# Patient Record
Sex: Female | Born: 1965
Health system: Southern US, Community
[De-identification: ages and names within clinical notes are randomized; demographics above are authoritative.]

## PROBLEM LIST (undated history)

## (undated) DIAGNOSIS — E079 Disorder of thyroid, unspecified: Secondary | ICD-10-CM

## (undated) HISTORY — DX: Disorder of thyroid, unspecified: E07.9

## (undated) HISTORY — PX: WISDOM TOOTH EXTRACTION: SHX21

## (undated) HISTORY — PX: TUBAL LIGATION: SHX77

## (undated) HISTORY — PX: COSMETIC SURGERY: SHX468

---

## 1997-11-18 ENCOUNTER — Ambulatory Visit (HOSPITAL_BASED_OUTPATIENT_CLINIC_OR_DEPARTMENT_OTHER): Admission: RE | Admit: 1997-11-18 | Discharge: 1997-11-18 | Payer: Self-pay | Admitting: Specialist

## 1998-12-12 ENCOUNTER — Other Ambulatory Visit: Admission: RE | Admit: 1998-12-12 | Discharge: 1998-12-12 | Payer: Self-pay | Admitting: Obstetrics

## 1999-01-08 ENCOUNTER — Other Ambulatory Visit: Admission: RE | Admit: 1999-01-08 | Discharge: 1999-01-08 | Payer: Self-pay | Admitting: *Deleted

## 1999-01-17 ENCOUNTER — Ambulatory Visit (HOSPITAL_COMMUNITY): Admission: RE | Admit: 1999-01-17 | Discharge: 1999-01-17 | Payer: Self-pay | Admitting: Family Medicine

## 1999-01-17 ENCOUNTER — Encounter: Payer: Self-pay | Admitting: Family Medicine

## 2000-08-04 ENCOUNTER — Other Ambulatory Visit: Admission: RE | Admit: 2000-08-04 | Discharge: 2000-08-04 | Payer: Self-pay | Admitting: Obstetrics and Gynecology

## 2000-11-21 ENCOUNTER — Inpatient Hospital Stay (HOSPITAL_COMMUNITY): Admission: AD | Admit: 2000-11-21 | Discharge: 2000-11-21 | Payer: Self-pay

## 2001-02-01 ENCOUNTER — Inpatient Hospital Stay (HOSPITAL_COMMUNITY): Admission: AD | Admit: 2001-02-01 | Discharge: 2001-02-03 | Payer: Self-pay | Admitting: Obstetrics and Gynecology

## 2001-02-01 ENCOUNTER — Encounter (INDEPENDENT_AMBULATORY_CARE_PROVIDER_SITE_OTHER): Payer: Self-pay | Admitting: Specialist

## 2001-08-03 ENCOUNTER — Other Ambulatory Visit: Admission: RE | Admit: 2001-08-03 | Discharge: 2001-08-03 | Payer: Self-pay | Admitting: Obstetrics and Gynecology

## 2002-08-03 ENCOUNTER — Other Ambulatory Visit: Admission: RE | Admit: 2002-08-03 | Discharge: 2002-08-03 | Payer: Self-pay | Admitting: Obstetrics and Gynecology

## 2003-05-05 ENCOUNTER — Ambulatory Visit (HOSPITAL_COMMUNITY): Admission: RE | Admit: 2003-05-05 | Discharge: 2003-05-05 | Payer: Self-pay | Admitting: Endocrinology

## 2005-02-01 ENCOUNTER — Ambulatory Visit: Payer: Self-pay | Admitting: Endocrinology

## 2005-06-28 ENCOUNTER — Other Ambulatory Visit: Admission: RE | Admit: 2005-06-28 | Discharge: 2005-06-28 | Payer: Self-pay | Admitting: Obstetrics and Gynecology

## 2005-09-16 ENCOUNTER — Ambulatory Visit (HOSPITAL_COMMUNITY): Admission: RE | Admit: 2005-09-16 | Discharge: 2005-09-16 | Payer: Self-pay | Admitting: Obstetrics and Gynecology

## 2006-03-06 ENCOUNTER — Ambulatory Visit: Payer: Self-pay | Admitting: Endocrinology

## 2007-02-13 ENCOUNTER — Encounter: Payer: Self-pay | Admitting: *Deleted

## 2007-02-13 DIAGNOSIS — H1010 Acute atopic conjunctivitis, unspecified eye: Secondary | ICD-10-CM | POA: Insufficient documentation

## 2007-02-13 DIAGNOSIS — J309 Allergic rhinitis, unspecified: Secondary | ICD-10-CM

## 2007-03-10 ENCOUNTER — Encounter: Payer: Self-pay | Admitting: Endocrinology

## 2007-03-10 ENCOUNTER — Ambulatory Visit: Payer: Self-pay | Admitting: Endocrinology

## 2007-03-10 LAB — CONVERTED CEMR LAB
Free T4: 0.9 ng/dL (ref 0.6–1.6)
TSH: 0.21 microintl units/mL — ABNORMAL LOW (ref 0.35–5.50)

## 2007-03-16 ENCOUNTER — Encounter: Admission: RE | Admit: 2007-03-16 | Discharge: 2007-03-16 | Payer: Self-pay | Admitting: Endocrinology

## 2007-04-17 ENCOUNTER — Encounter: Admission: RE | Admit: 2007-04-17 | Discharge: 2007-04-17 | Payer: Self-pay | Admitting: Endocrinology

## 2007-07-13 ENCOUNTER — Ambulatory Visit: Payer: Self-pay | Admitting: Endocrinology

## 2007-07-13 DIAGNOSIS — E0590 Thyrotoxicosis, unspecified without thyrotoxic crisis or storm: Secondary | ICD-10-CM | POA: Insufficient documentation

## 2007-07-13 LAB — CONVERTED CEMR LAB: TSH: 1.63 microintl units/mL (ref 0.35–5.50)

## 2007-08-11 ENCOUNTER — Ambulatory Visit (HOSPITAL_COMMUNITY): Admission: RE | Admit: 2007-08-11 | Discharge: 2007-08-11 | Payer: Self-pay | Admitting: Obstetrics and Gynecology

## 2007-08-11 ENCOUNTER — Ambulatory Visit: Payer: Self-pay | Admitting: Endocrinology

## 2008-08-16 ENCOUNTER — Ambulatory Visit (HOSPITAL_COMMUNITY): Admission: RE | Admit: 2008-08-16 | Discharge: 2008-08-16 | Payer: Self-pay | Admitting: Obstetrics and Gynecology

## 2009-08-17 ENCOUNTER — Ambulatory Visit (HOSPITAL_COMMUNITY): Admission: RE | Admit: 2009-08-17 | Discharge: 2009-08-17 | Payer: Self-pay | Admitting: Obstetrics and Gynecology

## 2010-04-27 ENCOUNTER — Ambulatory Visit: Payer: Self-pay | Admitting: Internal Medicine

## 2010-04-27 DIAGNOSIS — J019 Acute sinusitis, unspecified: Secondary | ICD-10-CM

## 2010-06-28 NOTE — Assessment & Plan Note (Signed)
Summary: NEW PT--[facility]--SINUS PROBLEM-PER DR Sammuel Cooper OK D/T---[facility]   Vital Signs:  Patient profile:   45 year old female Height:      66 inches Weight:      206.25 pounds BMI:     33.41 O2 Sat:      98 % on Room air Temp:     98.4 degrees F oral Pulse rate:   104 / minute BP sitting:   100 / 60  (left arm) Cuff size:   large  Vitals Entered By: Zella Ball Ewing CMA Duncan Dull) (April 27, 2010 4:07 PM)  O2 Flow:  Room air CC: New Patient, sinus congestion/RE   Primary Care Jaila Schellhorn:  Jonny Ruiz  CC:  New Patient and sinus congestion/RE.  History of Present Illness:  here with acute visit to establish with new PCP;  overall doing ok until 3 days ago with mild to mod fever, headache, facial pain, pressure and greenish d/c, mild ST and nonprod cough;  Pt denies CP, worsening sob, doe, wheezing, orthopnea, pnd, worsening LE edema, palps, dizziness or syncope  .Pt denies new neuro symptoms such as headache, facial or extremity weakness  Pt denies polydipsia, polyuria.  Overall good compliance with meds, trying to follow low chol diet  wt stable, little excercise however   Preventive Screening-Counseling & Management  Alcohol-Tobacco     Smoking Status: never      Drug Use:  no.    Problems Prior to Update: 1)  Sinusitis- Acute-nos  (ICD-461.9) 2)  Hyperthyroidism  (ICD-242.90) 3)  Allergic Rhinitis  (ICD-477.9)  Medications Prior to Update: 1)  None  Current Medications (verified): 1)  Levothyroxine Sodium 100 Mcg Tabs (Levothyroxine Sodium) .Marland Kitchen.. 1 By Mouth Once Daily 2)  Levofloxacin 500 Mg Tabs (Levofloxacin) .Marland Kitchen.. 1po Once Daily  Allergies (verified): No Known Drug Allergies  Past History:  Family History: Last updated: 04/27/2010 father with HTN aunt with DM  Social History: Last updated: 04/27/2010 Single 2 children work - cust service - [employer] Never Smoked Alcohol use-yes - social  Drug use-no  Risk Factors: Smoking Status: never (04/27/2010)  Past Medical  History: Allergic rhinitis Hyperthyroidism - Dr Talmage Nap  Past Surgical History: Reviewed history from 02/13/2007 and no changes required. Tubal ligation (1999) Breast Reduction (01/2001)  Family History: Reviewed history and no changes required. father with HTN aunt with DM  Social History: Reviewed history and no changes required. Single 2 children work - Biomedical engineer - [employer] Never Smoked Alcohol use-yes - social  Drug use-no Drug Use:  no  Review of Systems       all otherwise negative per pt -    Physical Exam  General:  alert and overweight-appearing., mild ill  Head:  normocephalic and atraumatic.   Eyes:  vision grossly intact, pupils equal, and pupils round.   Ears:  bilat tm's erythema, sinus tender bilat maxillary area Nose:  nasal dischargemucosal pallor and mucosal edema.   Mouth:  pharyngeal erythema and fair dentition.   Neck:  supple and cervical lymphadenopathy.   Lungs:  normal respiratory effort and normal breath sounds.   Heart:  normal rate and regular rhythm.   Extremities:  no edema, no erythema    Impression & Recommendations:  Problem # 1:  SINUSITIS- ACUTE-NOS (ICD-461.9)  Her updated medication list for this problem includes:    Levofloxacin 500 Mg Tabs (Levofloxacin) .Marland Kitchen... 1po once daily treat as above, f/u any worsening signs or symptoms   Complete Medication List: 1)  Levothyroxine Sodium 100 Mcg  Tabs (Levothyroxine sodium) .Marland Kitchen.. 1 by mouth once daily 2)  Levofloxacin 500 Mg Tabs (Levofloxacin) .Marland Kitchen.. 1po once daily  Patient Instructions: 1)  Please take all new medications as prescribed 2)  Continue all previous medications as before this visit  3)  You can also use Mucinex D OTC or it's generic for congestion  4)  Please schedule a follow-up appointment as needed. Prescriptions: LEVOFLOXACIN 500 MG TABS (LEVOFLOXACIN) 1po once daily  #10 x 0   Entered and Authorized by:   Corwin Levins MD   Signed by:   Corwin Levins MD on  04/27/2010   Method used:   Print then Give to Patient   RxID:   [id number]    Orders Added: 1)  Est. Patient Level III [28413]

## 2010-07-11 ENCOUNTER — Other Ambulatory Visit: Payer: Self-pay

## 2010-07-11 DIAGNOSIS — Z1231 Encounter for screening mammogram for malignant neoplasm of breast: Secondary | ICD-10-CM

## 2010-08-20 ENCOUNTER — Ambulatory Visit (HOSPITAL_COMMUNITY)
Admission: RE | Admit: 2010-08-20 | Discharge: 2010-08-20 | Disposition: A | Discharge status: Home / Self Care | Payer: 59 | Source: Ambulatory Visit | Attending: Obstetrics and Gynecology | Admitting: Obstetrics and Gynecology

## 2010-08-20 DIAGNOSIS — Z1231 Encounter for screening mammogram for malignant neoplasm of breast: Secondary | ICD-10-CM | POA: Insufficient documentation

## 2010-10-12 NOTE — Op Note (Signed)
NAMENANSI, B[place]HAM            ACCOUNT NO.:  [id number]   MEDICAL RECORD NO.:  [id number]          PATIENT TYPE:  AMB   LOCATION:  [facility]                           FACILITY:  [facility]   PHYSICIAN:  Dois Davenport A. Rivard, M.D. DATE OF BIRTH:  [birth year]   DATE OF PROCEDURE:  09/16/2005  DATE OF DISCHARGE:                                 OPERATIVE REPORT   PREOPERATIVE DIAGNOSIS:  Right lower quadrant pain.   POSTOPERATIVE DIAGNOSIS:  Right lower quadrant pain with pelvic adhesions.   ANESTHESIA:  General,  Dr. Dell Ponto.   PROCEDURE:  Laparoscopy, lysis of adhesions.   SURGEON:  Dr. Estanislado Pandy.   ESTIMATED BLOOD LOSS:  Minimal.   PROCEDURE:  After being informed of the planned procedure with possible  complications including bleeding, infection, injury to bowel, bladder or  ureters, and need for laparotomy, informed consent is obtained.  The patient  is taken to OR #3, given general anesthesia with endotracheal intubation  without complication. She is placed in the lithotomy position, prepped and  draped in sterile fashion. Her bladder is emptied with an in-and-out Foley  catheter. A weighted speculum was inserted in the vagina. Anterior lip of  the cervix was grasped with tenaculum forceps and the acorn manipulator is  placed. Speculum is removed.   We infiltrate the umbilical area using 9 mL of Marcaine 0.25 and perform an  umbilical incision which was brought down to the fascia overlooking the  previously existing incision. Fascia is then identified and grasped with  Kocher forceps. Fascia is then opened with scissors. Peritoneum is entered  bluntly with hemostat clamp. A pursestring suture of 0 Vicryl is placed on  the fascia and a Hassan trocar is easily inserted, held in place with  pursestring suture. We then insufflated pneumoperitoneum with CO2 at a  maximum pressure of 15 mmHg and we can insert the laparoscope on video  monitor. A suprapubic 5 mm trocar is then placed under direct  visualization  after infiltrating the area with 3 mL of Marcaine 0.25%.   Observation: Anterior cul-de-sac is normal. Uterus is bulky and soft which  could be compatible with adenomyosis but no other gross anomalies is noted.  Posterior cul-de-sac contains 37 mL of clear fluid which was aspirated so  that we can evaluate the peritoneum and uterosacral ligaments. Posterior cul-  de-sac is normal. Right tube is normal other than status post tubal  ligation. We do see two small paratubal cysts of less than 0.5 cm. Right  ovary is normal. The right ovarian fossa is completely normal and we see  normal peristalsis from the ureter. The appendix is then completely  visualized and normal.  Liver is normal. Left tube had a site of tubal  ligation and also is  held with a pelvic wall with a fine adhesions. Left  ovary shows the site of a recent ovulation with a corpus luteum, is  otherwise unremarkable.  Left ovarian fossa is normal and we do see normal  peristalsis from the ureter. Also on the left abdominal wall, we do see fine  adhesions holding the bowel to the wall.  Decision is made to proceed with  lysis of adhesions using sharp scissors and bipolar cauterization. We  released the tube from the pelvic wall as well as the bowel from the left  lateral wall. This was done easily with good visualization. We then irrigate  this area with saline and note a satisfactory hemostasis.   We then removed all instruments after evacuating the pneumoperitoneum and we  closed the fascia of the umbilical incision using our previously placed  pursestring suture. Unfortunately, the knot gives away and this and we need  to place a second suture on the fascia with a figure-of-eight stitch of 0  Vicryl. Skin is then closed with subcuticular suture of 3-0 Vicryl and Steri-  Strips.   Instrument and sponge count is complete x2. Estimated blood loss is minimal.  The procedures very well tolerated by the patient who  is taken to recovery  room in a well and stable condition.      Crist Fat Rivard, M.D.  Electronically Signed     SAR/MEDQ  D:  09/16/2005  T:  09/17/2005  Job:  [id number]

## 2010-10-12 NOTE — Discharge Summary (Signed)
[facility]  Patient:    Bianca Marquez, Bianca Marquez Visit Number: [id number] MRN: [id number]          Service Type: OBS Location: 910A [id number] 01 Attending Physician:  Leonard Schwartz Dictated by:   Marcelle Smiling Clelia Croft, C.N.M. Admit Date:  02/01/2001 Discharge Date: 02/03/2001                             Discharge Summary  DATE OF BIRTH:                12-Aug-1965.  ADMISSION DIAGNOSES:          1. Intrauterine pregnancy at term.                               2. Desires postpartum bilateral tubal ligation.  DISCHARGE DIAGNOSES:          1. Intrauterine pregnancy at term.                               2. Normal spontaneous vaginal birth.                               3. Postpartum sterilization.  PROCEDURES:                   Normal spontaneous vaginal birth and postpartum bilateral tubal ligation.  HOSPITAL COURSE:              [name] is a 45 year old gravida 2, para 1-0-0-1, who presented in active labor on February 01, 2001.  Pregnancy had been remarkable for: (1) Rh negative, (2) borderline pelvis, (3) hyperthyroidism, (4) history of breast reduction, and (5) desire BTL.  The patient progressed rapidly and normal spontaneous birth over intact perineum. Infant was a viable female named [place].  Weight was 6 pounds and 8 ounces with Apgars 9 and 9.  The patient was bottle-feeding.  Hemoglobin was 12.6 on day #1 postpartum.  The patient desired postpartum bilateral tubal ligation.  This was performed by Naima A. Dillard, M.D., on day #1 postpartum without complications.  By postpartum day #2 the patient was doing well and was deemed to have received the full benefit of her hospital stay. She was discharged home.  DISCHARGE INSTRUCTIONS:       Per [facility] handout.  DISCHARGE MEDICATIONS:        Motrin 600 mg p.o. q.6h. p.r.n. pain.  DISCHARGE FOLLOW-UP:          This will occur at six weeks postpartum at [facility]  OB/GYN. Dictated by:   Marcelle Smiling Clelia Croft, C.N.M. Attending Physician:  Leonard Schwartz DD:  02/03/01 TD:  02/03/01 Job: [id number]

## 2010-10-12 NOTE — H&P (Signed)
[facility]  Patient:    Bianca Marquez, Bianca Marquez Visit Number: [id number] MRN: [id number]          Service Type: OBS Location: [facility] Attending Physician:  Leonard Schwartz Dictated by:   Mack Guise, C.N.M. Adm. Date:  02/01/01                           History and Physical  HISTORY OF PRESENT ILLNESS:   Bianca Marquez is a 45 year old, gravida 2, para 1-0-0-1 at 38-2/7 weeks who presents in advanced active labor at 8 cm. She began her labor at 4:30 this morning and it has quickly increased in its intensity. She reports positive fetal movement, no bleeding, no rupture of membranes, denies any headaches, visual changes, or epigastric pain. Her pregnancy was initially evaluated at the office of [facility] on August 04, 2000 at [redacted] weeks gestation. Her pregnancy was determined by LMP dates and confirmed with pregnancy ultrasonography. Her pregnancy has unremarkable being size equal to dates throughout, normotensive with no proteinuria.  PRENATAL LABORATORY DATA:     Her prenatal lab work on July 31, 2000: Hemoglobin and hematocrit were 12.2 and 37.5, platelets were 330,000. Blood type and Rh AB negative, antibody screen negative, sickle cell negative, VDRL nonreactive, rubella immune, hepatitis B surface antigen negative, Pap smear within normal limits, GC and Chlamydia negative. AFP/free beta hCG was declined. At 28 weeks one-hour glucose challenge was 100. The patient did receive RhoGAM at 28 weeks and at 36 weeks culture of the vaginal tract is negative for group B strep.  OBSTETRICAL HISTORY:          In 1991 normal spontaneous vaginal delivery with the birth of 6-pound 8-ounce female infant at term with no complications and the present pregnancy.  PAST MEDICAL HISTORY:         Patient is hyperthyroid. She has had an abnormal Pap smear with conization done, history of Chlamydia in the year 2000.  PAST SURGICAL HISTORY:        In 1999 the patient had a  breast reduction surgery and wisdom teeth.  FAMILY HISTORY:               Father with a history of hypertension. Father with thyroidectomy and tracheostomy due to vocal cord damage. Paternal grandmother colon cancer, maternal aunt breast cancer.  GENETIC HISTORY:              There is no family history of familial or genetic disorders, children that died in infancy, or that were born with birth defects.  ALLERGIES:                    No known drug allergies.  HABITS:                       Patient denies the use of tobacco, alcohol, or illicit drugs.  SOCIAL HISTORY:               Bianca Marquez is a 45 year old African-American single female. She is of the [employer].  REVIEW OF SYSTEMS:            There are no signs or symptoms suggestive of focal or systemic disease and the patient is typical of one with a uterine pregnancy at term in advanced active labor.  PHYSICAL EXAMINATION:  VITAL SIGNS:  Stable, afebrile.  HEENT:                        Unremarkable.  HEART:                        Regular rate and rhythm.  LUNGS:                        Clear.  ABDOMEN:                      Gravid in its contour. It is soft and nontender. Leopolds maneuvers finds the infant to be in a longitudinal lie, cephalic presentation, and the estimated fetal weight is 7 pounds.  PELVIC:                       Digital exam of the cervix is 9 cm dilated with a bulging bag of water. Artificial rupture of membranes was employed with a return of clear fluid. Cervix is completely effaced with a cephalic presenting part at a -1 station.  ELECTRONIC FETAL MONITORING:  The fetal heart rate is entirely reassuring with accelerations noted. There were occasional mild variable decelerations. The patient is contracting every two to three minutes.  NEUROLOGIC/EXTREMITIES:       DTRs are 1+ with no clonus. Extremities show no pathologic edema.  ASSESSMENT:                   1.  Intrauterine pregnancy at term.                               2. Active labor.  PLAN:                         Admit per Dr. Stefano Gaul with routine CNM orders and anticipate spontaneous vaginal delivery. Dictated by:   Mack Guise, C.N.M. Attending Physician:  Leonard Schwartz DD:  02/01/01 TD:  02/01/01 Job: [id number]

## 2010-10-12 NOTE — Op Note (Signed)
[facility]  Patient:    Bianca Marquez, Bianca Marquez Visit Number: [id number] MRN: [id number]          Service Type: OBS Location: [facility] Attending Physician:  Leonard Schwartz Dictated by:   Pierre Bali Normand Sloop, M.D.                             Operative Report  PREOPERATIVE DIAGNOSES:       Multiparity, desires postpartum tubal ligation.  POSTOPERATIVE DIAGNOSES:      Multiparity, desires postpartum tubal ligation.  HISTORY:                      The patient was consented for the procedure. Was told the risks of bleeding, infection, and damage to internal organs such as bowel and bladder, and estimated failure rate of 1 in 250.  PROCEDURE:                    Postpartum tubal ligation, modified Pomeroy method.  SURGEON:                      Naima A. Normand Sloop, M.D.  ANESTHESIA:                   General.  ESTIMATED BLOOD LOSS:         Minimal.  INTRAVENOUS FLUIDS:           Crystalloid 1250 cc.  COMPLICATIONS:                None.  FINDINGS:                     Normal tubes bilaterally.                                Patient went to the recovery room in stable condition.  PROCEDURE:                    Patient was taken to the operating room.  She was given general anesthesia.  After being placed in the dorsal supine position she was prepped and draped in a normal sterile fashion.  A small infraumbilical incision was made with the scalpel and carried down to the fascia.  The fascia was then extended bilaterally using Mayo scissors.  The peritoneum was identified, tented up, and entered sharply with Metzenbaum scissors and extended bilaterally using Metzenbaum scissors.  The patients left fallopian tube was identified, grasped with Babcock clamps, and followed to the fimbriated end.  About a 2 cm portion of tube was excised approximately 3 cm away from the cornua.  First the tube was ligated with 2-0 plain and then excised.  Hemostasis was noted.   The patients left fallopian tube was identified and followed out to the fimbriated end in a similar fashion.  A 2 cm portion of tube was removed about 3 cm away from the corneal region.  It was ligated with 2-0 plain and excised.  Hemostasis was assured.  The fascia was closed with 0 Vicryl.  The skin was closed with 4-0 Vicryl.  Patient went to recovery room in stable condition. Dictated by:   Pierre Bali. Normand Sloop, M.D. Attending Physician:  Leonard Schwartz DD:  02/02/01 TD:  02/02/01 Job: [id number]

## 2011-08-05 ENCOUNTER — Other Ambulatory Visit: Payer: Self-pay | Admitting: Obstetrics and Gynecology

## 2011-08-05 DIAGNOSIS — Z1231 Encounter for screening mammogram for malignant neoplasm of breast: Secondary | ICD-10-CM

## 2011-08-17 ENCOUNTER — Encounter: Payer: Self-pay | Admitting: Family Medicine

## 2011-08-17 ENCOUNTER — Ambulatory Visit (INDEPENDENT_AMBULATORY_CARE_PROVIDER_SITE_OTHER): Payer: 59 | Admitting: Family Medicine

## 2011-08-17 VITALS — BP 130/82 | Temp 98.3°F | Wt 198.0 lb

## 2011-08-17 DIAGNOSIS — J069 Acute upper respiratory infection, unspecified: Secondary | ICD-10-CM

## 2011-08-17 MED ORDER — HYDROCODONE-HOMATROPINE 5-1.5 MG/5ML PO SYRP: ORAL_SOLUTION | ORAL | Status: AC

## 2011-08-17 NOTE — Progress Notes (Signed)
OFFICE NOTE  08/17/2011  CC:  Chief Complaint  Patient presents with  . Nasal Congestion     HPI: Patient is a 46 y.o. African-American female who is here for congestion. Pt presents complaining of respiratory symptoms for 2  days.  Mostly nasal congestion/runny nose, sneezing, , NT, and PND alt/w dry cough.  Worst symptoms seems to be the ST.  Lately the symptoms seem to be worsening.  No fevers, no wheezing, and no SOB.  No pain in face or teeth.  +fontal HA .   Symptoms made worse by nothing.  Symptoms improved by ST tea. Smoker? no Recent sick contact? no Muscle or joint aches? no Flu shot this season at least 2 wks ago? no  ROS: no n/v/d or abdominal pain.  No rash.  No neck stiffness.   +Mild fatigue.  +Mild appetite loss.   Pertinent PMH:  Hyperthyroidism: treated with meds Allergic rhinitis--spring season is the worst for her MEDS:  No outpatient prescriptions prior to visit.  Tylenol ST and ibuprofen.  PE: Blood pressure 130/82, temperature 98.3 F (36.8 C), temperature source Oral, weight 198 lb (89.812 kg). VS: noted--normal. Gen: alert, NAD, NONTOXIC APPEARING. HEENT: eyes without injection, drainage, or swelling.  Ears: EACs clear, TMs with normal light reflex and landmarks.  Nose: Clear rhinorrhea, with some dried, crusty exudate adherent to mildly injected mucosa.  No purulent d/c.  No paranasal sinus TTP.  No facial swelling.  Throat and mouth without focal lesion.  No pharyngial swelling, erythema, or exudate.   Neck: supple, no LAD.   LUNGS: CTA bilat, nonlabored resps.   CV: RRR, no m/r/g. EXT: no c/c/e SKIN: no rash  Rapid strep: NEG  IMPRESSION AND PLAN: Viral URI/pharyngitis. Self-limited nature of this illness was discussed, questions answered.  Discussed symptomatic care, rest, fluids.  Hycodan susp 1-2 tsp q6h prn, #117ml, no RF. Warning signs/symptoms of worsening illness were discussed.  Patient instructed to call or return if any of these  occur.   FOLLOW UP: prn

## 2011-08-29 ENCOUNTER — Ambulatory Visit (HOSPITAL_COMMUNITY)
Admission: RE | Admit: 2011-08-29 | Discharge: 2011-08-29 | Disposition: A | Discharge status: Home / Self Care | Payer: 59 | Source: Ambulatory Visit | Attending: Obstetrics and Gynecology | Admitting: Obstetrics and Gynecology

## 2011-08-29 DIAGNOSIS — Z1231 Encounter for screening mammogram for malignant neoplasm of breast: Secondary | ICD-10-CM

## 2011-12-11 ENCOUNTER — Ambulatory Visit (INDEPENDENT_AMBULATORY_CARE_PROVIDER_SITE_OTHER): Payer: 59 | Admitting: Obstetrics and Gynecology

## 2011-12-11 ENCOUNTER — Encounter: Payer: Self-pay | Admitting: Obstetrics and Gynecology

## 2011-12-11 VITALS — BP 118/72 | Ht 66.0 in | Wt 191.0 lb

## 2011-12-11 DIAGNOSIS — Z124 Encounter for screening for malignant neoplasm of cervix: Secondary | ICD-10-CM

## 2011-12-11 DIAGNOSIS — R1900 Intra-abdominal and pelvic swelling, mass and lump, unspecified site: Secondary | ICD-10-CM

## 2011-12-11 DIAGNOSIS — Z01419 Encounter for gynecological examination (general) (routine) without abnormal findings: Secondary | ICD-10-CM

## 2011-12-11 NOTE — Progress Notes (Signed)
The patient reports:normal menses, no abnormal bleeding, pelvic pain or discharge  Contraception:bilateral tubal ligation  Last mammogram: was normal April 2013 Last pap: was normal July  2012  GC/Chlamydia cultures offered: requested HIV/RPR/HbsAg offered:  requested HSV 1 and 2 glycoprotein offered: requested  Menstrual cycle regular and monthly: Yes Menstrual flow normal: Yes  Urinary symptoms: none Normal bowel movements: Yes Reports abuse at home: No  Subjective:    Bianca Marquez is a 46 y.o. female, G2P2, who presents for an annual exam.     History   Social History  . Marital Status: Single    Spouse Name: N/A    Number of Children: N/A  . Years of Education: N/A   Social History Main Topics  . Smoking status: Never Smoker   . Smokeless tobacco: Never Used  . Alcohol Use: No  . Drug Use: No  . Sexually Active: Yes    Birth Control/ Protection: Surgical     btl   Other Topics Concern  . None   Social History Narrative  . None    Menstrual cycle:   LMP: Patient's last menstrual period was 12/05/2011.           Cycle: normal  The following portions of the patient's history were reviewed and updated as appropriate: allergies, current medications, past family history, past medical history, past social history, past surgical history and problem list.  Review of Systems Pertinent items are noted in HPI. Breast:Negative for breast lump,nipple discharge or nipple retraction Gastrointestinal: Negative for abdominal pain, change in bowel habits or rectal bleeding Urinary:negative   Objective:    BP 118/72  Ht 5\' 6"  (1.676 m)  Wt 191 lb (86.637 kg)  BMI 30.83 kg/m2  LMP 12/05/2011    Weight:  Wt Readings from Last 1 Encounters:  12/11/11 191 lb (86.637 kg)          BMI: Body mass index is 30.83 kg/(m^2).  General Appearance: Alert, appropriate appearance for age. No acute distress HEENT: Grossly normal Neck / Thyroid: Supple, no masses, nodes or  enlargement Lungs: clear to auscultation bilaterally Back: No CVA tenderness Breast Exam: No masses or nodes.No dimpling, nipple retraction or discharge. Cardiovascular: Regular rate and rhythm. S1, S2, no murmur Gastrointestinal: Soft, non-tender, no masses or organomegaly Pelvic Exam: Vulva and vagina appear normal. Bimanual exam reveals normal uterus and left adnexa. Right adnexa enlarged? Rectovaginal: normal rectal, no masses Lymphatic Exam: Non-palpable nodes in neck, clavicular, axillary, or inguinal regions Skin: no rash or abnormalities Neurologic: Normal gait and speech, no tremor  Psychiatric: Alert and oriented, appropriate affect.     Assessment:    N  Possible pelvic mass   Plan:    mammogram pap smear return annually or prn STD screening: declined Will order pelvic ultrasound Contraception:bilateral tubal ligation      Miki Labuda AMD

## 2011-12-13 LAB — PAP IG, CT-NG, RFX HPV ASCU
Chlamydia Probe Amp: NEGATIVE
GC Probe Amp: NEGATIVE

## 2012-01-17 ENCOUNTER — Other Ambulatory Visit: Payer: Self-pay | Admitting: Obstetrics and Gynecology

## 2012-01-17 ENCOUNTER — Ambulatory Visit (INDEPENDENT_AMBULATORY_CARE_PROVIDER_SITE_OTHER): Payer: 59

## 2012-01-17 ENCOUNTER — Ambulatory Visit (INDEPENDENT_AMBULATORY_CARE_PROVIDER_SITE_OTHER): Payer: 59 | Admitting: Obstetrics and Gynecology

## 2012-01-17 ENCOUNTER — Encounter: Payer: Self-pay | Admitting: Obstetrics and Gynecology

## 2012-01-17 VITALS — BP 110/64 | Wt 190.0 lb

## 2012-01-17 DIAGNOSIS — R1900 Intra-abdominal and pelvic swelling, mass and lump, unspecified site: Secondary | ICD-10-CM

## 2012-01-17 DIAGNOSIS — N83209 Unspecified ovarian cyst, unspecified side: Secondary | ICD-10-CM

## 2012-01-17 NOTE — Progress Notes (Signed)
Subjective:    Bianca Marquez is a 46 y.o. female, G2P2, who presents for Gyn ultrasound because of possible pelvic mass at [facility].  The following portions of the patient's history were reviewed and updated as appropriate: allergies, current medications, past family history.  Objective:    BP 110/64  Wt 190 lb (86.183 kg)  LMP 12/28/2011    Weight:  Wt Readings from Last 1 Encounters:  01/17/12 190 lb (86.183 kg)          BMI: There is no height on file to calculate BMI.  ULTRASOUND: Uterus normal    Adnexa left simple ovarian cyst 3.7 cm    Endometrium 5.3 mm    Free fluid: no    Other findings:  Fibroids 3.6 cm, 2.8 cm   Assessment:    Asymptomatic fibroids  Left ovarian cyst   Plan:    Repeat ultrasound in 3 months

## 2012-02-11 ENCOUNTER — Telehealth: Payer: Self-pay | Admitting: Obstetrics and Gynecology

## 2012-02-11 DIAGNOSIS — N83209 Unspecified ovarian cyst, unspecified side: Secondary | ICD-10-CM

## 2012-02-11 NOTE — Telephone Encounter (Signed)
U/s order entered in system.  Bianca Marquez

## 2012-04-17 ENCOUNTER — Ambulatory Visit (INDEPENDENT_AMBULATORY_CARE_PROVIDER_SITE_OTHER): Payer: 59

## 2012-04-17 ENCOUNTER — Other Ambulatory Visit: Payer: Self-pay | Admitting: Obstetrics and Gynecology

## 2012-04-17 ENCOUNTER — Encounter: Payer: Self-pay | Admitting: Obstetrics and Gynecology

## 2012-04-17 ENCOUNTER — Ambulatory Visit (INDEPENDENT_AMBULATORY_CARE_PROVIDER_SITE_OTHER): Payer: 59 | Admitting: Obstetrics and Gynecology

## 2012-04-17 VITALS — BP 110/72 | Resp 14 | Ht 66.0 in | Wt 193.0 lb

## 2012-04-17 DIAGNOSIS — N83202 Unspecified ovarian cyst, left side: Secondary | ICD-10-CM

## 2012-04-17 DIAGNOSIS — N83209 Unspecified ovarian cyst, unspecified side: Secondary | ICD-10-CM

## 2012-04-17 NOTE — Progress Notes (Signed)
Subjective:    Bianca Marquez is a 46 y.o. female, G2P2, who presents for Gyn ultrasound because of L OV cysts.  The following portions of the patient's history were reviewed and updated as appropriate: allergies, current medications, past family history.  Objective:    BP 110/72  Resp 14  Ht 5\' 6"  (1.676 m)  Wt 193 lb (87.544 kg)  BMI 31.15 kg/m2  LMP 04/04/2012    Weight:  Wt Readings from Last 1 Encounters:  04/17/12 193 lb (87.544 kg)          BMI: Body mass index is 31.15 kg/(m^2).  ULTRASOUND: Uterus Anteverted     Adnexa N/A    Endometrium 0.397 mm    Free fluid: no    Other findings:  Several small Nabothian cysts and several myometrial fibroids are seen    RT anterior - 3.3 cm x 3.1cm x 3.8 cm    RT anteriror - 3.0 cm. X 2.2 cm x 2.7 cm    Posterior Lus - 2.9 cm x 2.3 cm x 2.9 cm     RTOV WNL's    LTOV WNL's previously seen cysts are not visualized  Assessment:    left ovarian cyst resolved   NSC in fibroids Plan:    Follow-up at [facility]  Silverio Lay MD

## 2012-08-13 ENCOUNTER — Other Ambulatory Visit: Payer: Self-pay | Admitting: Obstetrics and Gynecology

## 2012-08-13 DIAGNOSIS — Z1231 Encounter for screening mammogram for malignant neoplasm of breast: Secondary | ICD-10-CM

## 2012-09-01 ENCOUNTER — Ambulatory Visit (HOSPITAL_COMMUNITY): Payer: 59

## 2012-09-09 ENCOUNTER — Ambulatory Visit (HOSPITAL_COMMUNITY)
Admission: RE | Admit: 2012-09-09 | Discharge: 2012-09-09 | Disposition: A | Discharge status: Home / Self Care | Payer: 59 | Source: Ambulatory Visit | Attending: Obstetrics and Gynecology | Admitting: Obstetrics and Gynecology

## 2012-09-09 DIAGNOSIS — Z1231 Encounter for screening mammogram for malignant neoplasm of breast: Secondary | ICD-10-CM

## 2013-01-08 ENCOUNTER — Ambulatory Visit (INDEPENDENT_AMBULATORY_CARE_PROVIDER_SITE_OTHER): Payer: 59 | Admitting: Internal Medicine

## 2013-01-08 ENCOUNTER — Encounter: Payer: Self-pay | Admitting: Internal Medicine

## 2013-01-08 VITALS — BP 106/70 | HR 109 | Temp 98.2°F | Ht 66.0 in | Wt 198.8 lb

## 2013-01-08 DIAGNOSIS — J309 Allergic rhinitis, unspecified: Secondary | ICD-10-CM

## 2013-01-08 DIAGNOSIS — J019 Acute sinusitis, unspecified: Secondary | ICD-10-CM

## 2013-01-08 MED ORDER — LEVOFLOXACIN 250 MG PO TABS: 250.0000 mg | ORAL_TABLET | Freq: Every day | ORAL | Status: DC

## 2013-01-08 NOTE — Patient Instructions (Signed)
Please take all new medication as prescribed - the antibiotic Please continue all other medications as before, and refills have been done if requested. You can also use Allegra D (OTC) for allergis and congestion as needed  Please remember to sign up for My Chart if you have not done so, as this will be important to you in the future with finding out test results, communicating by private email, and scheduling acute appointments online when needed.

## 2013-01-10 NOTE — Progress Notes (Signed)
  Subjective:    Patient ID: Bianca Marquez, female    DOB: 03-May-1966, 47 y.o.   MRN: [id number]  HPI   Here with 2-3 days acute onset fever, facial pain, pressure, headache, general weakness and malaise, and greenish d/c, with mild ST and cough, but pt denies chest pain, wheezing, increased sob or doe, orthopnea, PND, increased LE swelling, palpitations, dizziness or syncope.  Does have several wks ongoing nasal allergy symptoms with clearish congestion, itch and sneezing, without fever, pain, ST, cough, swelling or wheezing. Past Medical History  Diagnosis Date  . Thyroid disease    Past Surgical History  Procedure Laterality Date  . Wisdom tooth extraction    . Cosmetic surgery      breast reduction   . Tubal ligation      reports that she has never smoked. She has never used smokeless tobacco. She reports that she does not drink alcohol or use illicit drugs. family history includes Breast cancer in her maternal aunt and paternal aunt; Colon cancer in her maternal grandmother; Hypertension in her father. No Known Allergies Current Outpatient Prescriptions on File Prior to Visit  Medication Sig Dispense Refill  . levothyroxine (SYNTHROID, LEVOTHROID) 100 MCG tablet Take 100 mcg by mouth daily.       No current facility-administered medications on file prior to visit.   Review of Systems All otherwise neg per pt     Objective:   Physical Exam BP 106/70  Pulse 109  Temp(Src) 98.2 F (36.8 C) (Oral)  Ht 5\' 6"  (1.676 m)  Wt 198 lb 12 oz (90.152 kg)  BMI 32.09 kg/m2  SpO2 97% VS noted, mild ill Constitutional: Pt appears well-developed and well-nourished.  HENT: Head: NCAT.  Right Ear: External ear normal.  Left Ear: External ear normal.  Bilat tm's with mild erythema.  Max sinus areas mild tender.  Pharynx with mild erythema, no exudate Eyes: Conjunctivae and EOM are normal. Pupils are equal, round, and reactive to light.  Neck: Normal range of motion. Neck supple.   Cardiovascular: Normal rate and regular rhythm.   Pulmonary/Chest: Effort normal and breath sounds normal.  Neurological: Pt is alert. Not confused  Skin: Skin is warm. No erythema.  Psychiatric: Pt behavior is normal. Thought content normal.     Assessment & Plan:

## 2013-01-10 NOTE — Assessment & Plan Note (Signed)
Ok for [employer] D prn,  to f/u any worsening symptoms or concerns

## 2013-01-10 NOTE — Assessment & Plan Note (Signed)
Mild to mod, for antibx course,  to f/u any worsening symptoms or concerns 

## 2013-04-20 ENCOUNTER — Ambulatory Visit: Payer: 59 | Admitting: Interventional Cardiology

## 2013-05-24 ENCOUNTER — Other Ambulatory Visit (INDEPENDENT_AMBULATORY_CARE_PROVIDER_SITE_OTHER): Payer: 59

## 2013-05-24 ENCOUNTER — Encounter: Payer: Self-pay | Admitting: Internal Medicine

## 2013-05-24 ENCOUNTER — Ambulatory Visit (INDEPENDENT_AMBULATORY_CARE_PROVIDER_SITE_OTHER): Payer: 59 | Admitting: Internal Medicine

## 2013-05-24 VITALS — BP 118/72 | HR 84 | Temp 97.6°F | Resp 16 | Wt 205.0 lb

## 2013-05-24 DIAGNOSIS — E0590 Thyrotoxicosis, unspecified without thyrotoxic crisis or storm: Secondary | ICD-10-CM

## 2013-05-24 DIAGNOSIS — T783XXA Angioneurotic edema, initial encounter: Secondary | ICD-10-CM

## 2013-05-24 DIAGNOSIS — E039 Hypothyroidism, unspecified: Secondary | ICD-10-CM

## 2013-05-24 LAB — CBC WITH DIFFERENTIAL/PLATELET
Basophils Absolute: 0 10*3/uL (ref 0.0–0.1)
Eosinophils Absolute: 0 10*3/uL (ref 0.0–0.7)
Hemoglobin: 14 g/dL (ref 12.0–15.0)
Lymphocytes Relative: 44.8 % (ref 12.0–46.0)
Neutrophils Relative %: 48.4 % (ref 43.0–77.0)
Platelets: 298 10*3/uL (ref 150.0–400.0)

## 2013-05-24 LAB — TSH: TSH: 4.9 u[IU]/mL (ref 0.35–5.50)

## 2013-05-24 LAB — COMPREHENSIVE METABOLIC PANEL
AST: 14 U/L (ref 0–37)
Alkaline Phosphatase: 37 U/L — ABNORMAL LOW (ref 39–117)
CO2: 24 mEq/L (ref 19–32)
Creatinine, Ser: 0.9 mg/dL (ref 0.4–1.2)
GFR: 84.25 mL/min (ref >60.00)
Total Bilirubin: 0.4 mg/dL (ref 0.3–1.2)

## 2013-05-24 MED ORDER — CETIRIZINE HCL 10 MG PO TABS: 10.0000 mg | ORAL_TABLET | Freq: Two times a day (BID) | ORAL | Status: DC

## 2013-05-24 MED ORDER — METHYLPREDNISOLONE ACETATE 80 MG/ML IJ SUSP
120.0000 mg | Freq: Once | INTRAMUSCULAR | Status: AC
  Administered 2013-05-24: 120 mg via INTRAMUSCULAR

## 2013-05-24 MED ORDER — TRIAMCINOLONE ACETONIDE 0.5 % EX CREA: 1.0000 "application " | TOPICAL_CREAM | Freq: Three times a day (TID) | CUTANEOUS | Status: DC

## 2013-05-24 NOTE — Progress Notes (Signed)
   Subjective:    Patient ID: Bianca Marquez, female    DOB: 07/28/1965, 47 y.o.   MRN: [id number]  HPI  New to me she complains of a 3 day history of feeling like her upper and lower lips are chapped and swollen. She has been taking an OTC med for allergies but she does not know what its ingredients are.   Review of Systems  Constitutional: Negative.  Negative for fever, chills, diaphoresis, activity change, appetite change, fatigue and unexpected weight change.  HENT: Negative for congestion, dental problem, drooling, facial swelling, mouth sores, nosebleeds, postnasal drip, rhinorrhea, sinus pressure, sneezing, sore throat, tinnitus, trouble swallowing and voice change.   Eyes: Negative.   Respiratory: Negative.  Negative for cough, choking, chest tightness, shortness of breath, wheezing and stridor.   Cardiovascular: Negative.  Negative for chest pain, palpitations and leg swelling.  Gastrointestinal: Negative.  Negative for nausea, vomiting, abdominal pain, diarrhea and constipation.  Endocrine: Negative.   Genitourinary: Negative.   Musculoskeletal: Negative.   Skin: Negative.   Allergic/Immunologic: Negative.   Neurological: Negative.   Hematological: Negative.  Negative for adenopathy. Does not bruise/bleed easily.  Psychiatric/Behavioral: Negative.        Objective:   Physical Exam  Vitals reviewed. Constitutional: She is oriented to person, place, and time. Vital signs are normal. She appears well-developed and well-nourished.  Non-toxic appearance. She does not have a sickly appearance. She does not appear ill. No distress.  HENT:  Head: Normocephalic and atraumatic.  Mouth/Throat: Oropharynx is clear and moist and mucous membranes are normal. Mucous membranes are not pale, not dry and not cyanotic. She does not have dentures. No oral lesions. No trismus in the jaw. Normal dentition. No dental abscesses, uvula swelling, lacerations or dental caries. No oropharyngeal  exudate, posterior oropharyngeal edema, posterior oropharyngeal erythema or tonsillar abscesses.  Her upper and lower lips are mildly and diffusely swollen, there are no lesions along the vermilion borders.  Eyes: Conjunctivae are normal. Right eye exhibits no discharge. Left eye exhibits no discharge. No scleral icterus.  Neck: Normal range of motion. Neck supple. No JVD present. No tracheal deviation present. No thyromegaly present.  Cardiovascular: Normal rate, regular rhythm, normal heart sounds and intact distal pulses.  Exam reveals no gallop and no friction rub.   No murmur heard. Pulmonary/Chest: Effort normal and breath sounds normal. No stridor. No respiratory distress. She has no wheezes. She has no rales. She exhibits no tenderness.  Abdominal: Soft. Bowel sounds are normal. She exhibits no distension and no mass. There is no tenderness. There is no rebound and no guarding.  Musculoskeletal: Normal range of motion. She exhibits no edema and no tenderness.  Lymphadenopathy:    She has no cervical adenopathy.  Neurological: She is oriented to person, place, and time.  Skin: Skin is warm and dry. No rash noted. She is not diaphoretic. No erythema. No pallor.  Psychiatric: She has a normal mood and affect. Her behavior is normal. Judgment and thought content normal.     Lab Results  Component Value Date   TSH 2.37 08/11/2007       Assessment & Plan:

## 2013-05-24 NOTE — Assessment & Plan Note (Signed)
I think there may be an nsaid in her OTC med that may be causing this so I have asked her to stop taking it I will treat the allergic reaction with an injection of depo-medrol, topical sterids, and zyrtec Will also check her labs to look for secondary causes of angioedema

## 2013-05-24 NOTE — Patient Instructions (Signed)
Angioedema Angioedema (AE) is a sudden swelling of the eyelids, lips, lobes of ears, external genitalia, skin, and other parts of the body. AE can happen by itself. It usually begins during the night and is found on awakening. It can happen with hives and other allergic reactions. Attacks can be mild and annoying, or life-threatening if the air passages swell. AE generally occurs in a short time period (over minutes to hours) and gets better in 24 to 48 hours. It usually does not cause any serious problems.  There are 2 different kinds of AE:   Allergic AE.  Nonallergic AE.  There may be an overreaction or direct stimulation of cells that are a part of the immune system (mast cells).  There may be problems with the release of chemicals made by the body that cause swelling and inflammation (kinins). AE due to kinins can be inherited from parents (hereditary), or it can develop on its own (acquired). Acquired AE either shows up before, or along with, certain diseases or is due to the body's immune system attacking parts of the body's own cells (autoimmune). CAUSES  Allergic  AE due to allergic reactions are caused by something that causes the body to react (trigger). Common triggers include:  Foods.  Medicines.  Latex.  Direct contact with certain fruits, vegetables, or animal saliva.  Insect stings. Nonallergic  Mast cell stimulation may be caused by:  Medicines.  Dyes used in X-rays.  The body's own immune system reactions to parts of the body (autoimmune disease).  Possibly, some virus infections.  AE due to problems with kinins can be hereditary or acquired. Attacks are triggered by:  Mild injury.  Dental work or any surgery.  Stress.  Sudden changes in temperature.  Exercise.  Medicines.  AE due to problems with kinins can also be due to certain medicines, especially blood pressure medicines like angiotensin-converting enzyme (ACE) inhibitors. African Americans  are at nearly 5 times greater risk of developing AE than Caucasians from ACE inhibitors. SYMPTOMS  Allergic symptoms:  Non-itchy swelling of the skin. Often the swelling is on the face and lips, but any area of the skin can swell. Sometimes, the swelling can be painful. If hives are present, there is intense itching.  Breathing problems if the air passages swell. Nonallergic symptoms:  If internal organs are involved, there may be:  Nausea.  Abdominal pain.  Vomiting.  Difficulty swallowing.  Difficulty passing urine.  Breathing problems if the air passages swell. Depending on the cause of AE, episodes may:  Only happen once (if triggers are removed or avoided).  Come back in unpredictable patterns.  Repeat for several years and then gradually fade away. DIAGNOSIS  AE is diagnosed by:   Asking questions to find out how fast the symptoms began.  Taking a family history.  Physical exam.  Diagnostic tests. Tests could include:  Allergy skin tests to see if the problem is allergic.  Blood tests to diagnose hereditary and some acquired types of AE.  Other tests to see if there is a hidden disease leading to the AE. TREATMENT  Treatment depends on the type and cause (if any) of the AE. Allergic  Allergic types of AE are treated with:  Immediate removal of the trigger or medicine (if any).  Epinephrine injection.  Steroids.  Antihistamines.  Hospitalization for severe attacks. Nonallergic  Mast cell stimulation types of AE are treated with:  Immediate removal of the trigger or medicine (if any).  Epinephrine injection.    Steroids.  Antihistamines.  Hospitalization for severe attacks.  Hereditary AE is treated with:  Medicines to prevent and treat attacks. There is little response to antihistamines, epinephrine, or steroids.  Preventive medicines before dental work or surgery.  Removing or avoiding medicines that trigger  attacks.  Hospitalization for severe attacks.  Acquired AE is treated with:  Treating underlying disease (if any).  Medicines to prevent and treat attacks. HOME CARE INSTRUCTIONS   Always carry your emergency allergy treatment medicines with you.  Wear a medical bracelet.  Avoid known triggers. SEEK MEDICAL CARE IF:   You get repeat attacks.  Your attacks are more frequent or more severe despite preventive measures.  You have hereditary AE and are considering having children. It is important to discuss the risks of passing this on to your children. SEEK IMMEDIATE MEDICAL CARE IF:   You have difficulty breathing.  You have difficulty swallowing.  You experience fainting. This condition should be treated immediately. It can be life-threatening if it involves throat swelling. Document Released: 07/22/2001 Document Revised: 08/05/2011 Document Reviewed: 01/04/2013 [employer] Patient Information 2014 [employer].  

## 2013-05-24 NOTE — Assessment & Plan Note (Signed)
I will check her TSH and will adjust her dose if needed 

## 2013-06-09 ENCOUNTER — Ambulatory Visit: Payer: 59 | Admitting: Interventional Cardiology

## 2013-06-15 ENCOUNTER — Encounter: Payer: Self-pay | Admitting: Interventional Cardiology

## 2013-06-15 ENCOUNTER — Ambulatory Visit (INDEPENDENT_AMBULATORY_CARE_PROVIDER_SITE_OTHER): Payer: 59 | Admitting: Interventional Cardiology

## 2013-06-15 VITALS — BP 106/84 | HR 88 | Ht 66.0 in | Wt 202.0 lb

## 2013-06-15 DIAGNOSIS — R002 Palpitations: Secondary | ICD-10-CM

## 2013-06-15 DIAGNOSIS — E039 Hypothyroidism, unspecified: Secondary | ICD-10-CM

## 2013-06-15 NOTE — Progress Notes (Signed)
Patient ID: Bianca Marquez, female   DOB: 1966/01/23, 48 y.o.   MRN: [id number]   Date: 06/15/2013 ID: Bianca Marquez, DOB June 29, 1965, MRN [id number] PCP: Oliver Barre, MD  Reason: Rapid heartbeat  ASSESSMENT;  1. Palpitations described as feeling the heart race, lasting less than 20 seconds before abruptly resolving. Suspect PSVT 2. Thyroid disease on thyroid replacement therapy  PLAN:  1. Since the time of the referral, the patient has had no episodes of tachycardia. At worse she was having 1 or 2 episodes per week lasting up to 20 seconds before resolving spontaneously. The current plan is clinical observation. No specific cardiac studies ordered at this time. If symptoms recur and when not able to catch it on EKG, we will perform continuous ambulatory monitoring for up to 30 days. In the meantime I have asked her to report immediately to our office or the nearest medical facility for an EKG if she develops an episode of sustained racing heart.   SUBJECTIVE: Bianca Marquez is a 48 y.o. female who is 26 with a history of thyroid disease who is referred by Dr. Cleon Gustin for evaluation of tachycardia/palpitations. Starting in November she began experiencing episodes of racing heart that would occur spontaneously and last no more than 30 seconds. He will cause her to feel odd, she would take deep breaths, she would cough, and then eventually the sensation would spontaneously resolve. She did not feel dizzy or lightheaded. There were no episodes of chest pain. No syncope or near syncope occurred. No prior history of heart disease. She denies rheumatic fever. Her only known medical problem is thyroid disease.   No Known Allergies  Current Outpatient Prescriptions on File Prior to Visit  Medication Sig Dispense Refill  . cetirizine (ZYRTEC) 10 MG tablet Take 1 tablet (10 mg total) by mouth 2 (two) times daily.  60 tablet  1  . levothyroxine (SYNTHROID, LEVOTHROID) 100 MCG tablet Take 100  mcg by mouth daily.      Marland Kitchen triamcinolone cream (KENALOG) 0.5 % Apply 1 application topically 3 (three) times daily.  30 g  0   No current facility-administered medications on file prior to visit.    Past Medical History  Diagnosis Date  . Thyroid disease     Past Surgical History  Procedure Laterality Date  . Wisdom tooth extraction    . Cosmetic surgery      breast reduction   . Tubal ligation      History   Social History  . Marital Status: Single    Spouse Name: N/A    Number of Children: N/A  . Years of Education: N/A   Occupational History  . Not on file.   Social History Main Topics  . Smoking status: Never Smoker   . Smokeless tobacco: Never Used  . Alcohol Use: No  . Drug Use: No  . Sexual Activity: Yes    Birth Control/ Protection: Surgical     Comment: btl   Other Topics Concern  . Not on file   Social History Narrative  . No narrative on file    Family History  Problem Relation Age of Onset  . Hypertension Father   . Breast cancer Maternal Aunt   . Colon cancer Maternal Grandmother   . Breast cancer Paternal Aunt     ROS: Overweight but stable. Appetite is good. No chills or fever. No episodes of syncope. No history of neurological disease or transient neurological complaints. She has no physical limitations  with reference to physical activity. She denies edema. No recent change in thyroid medication. No weight loss, hair loss, orthopnea, PND, or excessive daytime sleepiness. Other systems negative for complaints.  OBJECTIVE: BP 106/84  Pulse 88  Ht 5\' 6"  (1.676 m)  Wt 202 lb (91.627 kg)  BMI 32.62 kg/m2  LMP 05/05/2013,  General: No acute distress, mild to moderate obesity HEENT: normal  Neck: JVD flat. Carotids no bruits with 2+ bilateral upstroke Chest: Clear Cardiac: Murmur: Normal. Gallop: Normal. Rhythm: Normal. Other: Normal Abdomen: Bruit: Absent. Pulsation: Absent Extremities: Edema: None. Pulses: 2+ bilateral Neuro:  Normal Psych: Normal  ECG: Normal with PR interval 136 ms

## 2013-06-15 NOTE — Patient Instructions (Signed)
Your physician recommends that you continue on your current medications as directed. Please refer to the Current Medication list given to you today.  If you feel your heart racing persistently go to the nearest medical facility to have a EKG  If you develop additional symptoms: shortness of breath, chest pain, please call the office

## 2013-06-26 ENCOUNTER — Encounter: Payer: Self-pay | Admitting: Family Medicine

## 2013-06-26 ENCOUNTER — Ambulatory Visit (INDEPENDENT_AMBULATORY_CARE_PROVIDER_SITE_OTHER): Payer: 59 | Admitting: Family Medicine

## 2013-06-26 VITALS — BP 118/80 | HR 98 | Temp 97.5°F | Ht 66.0 in | Wt 203.5 lb

## 2013-06-26 DIAGNOSIS — H109 Unspecified conjunctivitis: Secondary | ICD-10-CM | POA: Insufficient documentation

## 2013-06-26 NOTE — Progress Notes (Signed)
   Subjective:    Patient ID: Bianca Marquez, female    DOB: 06-07-1965, 48 y.o.   MRN: [id number]  HPI 48 y/o married fem NS w 4 day h/o of R eye redness and cold symtoms   Review of Systems    neg  Objective:   Physical Exam   wdwn fem NAD.   HEENT......Marland Kitchen.negative except redness r conjunctivus      Assessment & Plan:  Viral uri w conjunctivits.Marland Kitchen.Marland Kitchen.Marland Kitchen.See orders

## 2013-06-26 NOTE — Progress Notes (Signed)
Pre-visit discussion using our clinic review tool. No additional management support is needed unless otherwise documented below in the visit note.  

## 2013-06-26 NOTE — Patient Instructions (Signed)
CLEAR EYS eye drops......2 drops 4 x day until clear  Warm soaks 4 x day   Return prn

## 2013-09-01 ENCOUNTER — Other Ambulatory Visit: Payer: Self-pay | Admitting: Obstetrics and Gynecology

## 2013-09-01 DIAGNOSIS — Z1231 Encounter for screening mammogram for malignant neoplasm of breast: Secondary | ICD-10-CM

## 2013-09-14 ENCOUNTER — Ambulatory Visit (HOSPITAL_COMMUNITY)
Admission: RE | Admit: 2013-09-14 | Discharge: 2013-09-14 | Disposition: A | Discharge status: Home / Self Care | Payer: 59 | Source: Ambulatory Visit | Attending: Obstetrics and Gynecology | Admitting: Obstetrics and Gynecology

## 2013-09-14 DIAGNOSIS — Z1231 Encounter for screening mammogram for malignant neoplasm of breast: Secondary | ICD-10-CM

## 2014-03-28 ENCOUNTER — Encounter: Payer: Self-pay | Admitting: Family Medicine

## 2014-05-04 ENCOUNTER — Other Ambulatory Visit: Payer: Self-pay | Admitting: Internal Medicine

## 2014-08-08 ENCOUNTER — Other Ambulatory Visit: Payer: Self-pay | Admitting: Internal Medicine

## 2014-08-29 ENCOUNTER — Other Ambulatory Visit (HOSPITAL_COMMUNITY): Payer: Self-pay | Admitting: Obstetrics and Gynecology

## 2014-08-29 DIAGNOSIS — Z1231 Encounter for screening mammogram for malignant neoplasm of breast: Secondary | ICD-10-CM

## 2014-09-20 ENCOUNTER — Ambulatory Visit (HOSPITAL_COMMUNITY): Payer: 59

## 2014-09-27 ENCOUNTER — Ambulatory Visit (HOSPITAL_COMMUNITY)
Admission: RE | Admit: 2014-09-27 | Discharge: 2014-09-27 | Disposition: A | Discharge status: Home / Self Care | Payer: 59 | Source: Ambulatory Visit | Attending: Obstetrics and Gynecology | Admitting: Obstetrics and Gynecology

## 2014-09-27 DIAGNOSIS — Z1231 Encounter for screening mammogram for malignant neoplasm of breast: Secondary | ICD-10-CM | POA: Diagnosis present

## 2014-10-20 ENCOUNTER — Other Ambulatory Visit: Payer: Self-pay | Admitting: Internal Medicine

## 2014-11-01 ENCOUNTER — Other Ambulatory Visit: Payer: Self-pay | Admitting: Internal Medicine

## 2015-02-23 ENCOUNTER — Other Ambulatory Visit: Payer: Self-pay | Admitting: Internal Medicine

## 2015-05-25 ENCOUNTER — Encounter: Payer: Self-pay | Admitting: Internal Medicine

## 2015-05-25 ENCOUNTER — Ambulatory Visit (INDEPENDENT_AMBULATORY_CARE_PROVIDER_SITE_OTHER): Payer: 59 | Admitting: Internal Medicine

## 2015-05-25 VITALS — BP 116/72 | HR 97 | Temp 97.8°F | Wt 204.0 lb

## 2015-05-25 DIAGNOSIS — J309 Allergic rhinitis, unspecified: Secondary | ICD-10-CM

## 2015-05-25 MED ORDER — METHYLPREDNISOLONE ACETATE 80 MG/ML IJ SUSP
80.0000 mg | Freq: Once | INTRAMUSCULAR | Status: AC
  Administered 2015-05-25: 80 mg via INTRAMUSCULAR

## 2015-05-25 NOTE — Patient Instructions (Signed)
Allergic Rhinitis Allergic rhinitis is when the mucous membranes in the nose respond to allergens. Allergens are particles in the air that cause your body to have an allergic reaction. This causes you to release allergic antibodies. Through a chain of events, these eventually cause you to release histamine into the blood stream. Although meant to protect the body, it is this release of histamine that causes your discomfort, such as frequent sneezing, congestion, and an itchy, runny nose.  CAUSES Seasonal allergic rhinitis (hay fever) is caused by pollen allergens that may come from grasses, trees, and weeds. Year-round allergic rhinitis (perennial allergic rhinitis) is caused by allergens such as house dust mites, pet dander, and mold spores. SYMPTOMS  Nasal stuffiness (congestion).  Itchy, runny nose with sneezing and tearing of the eyes. DIAGNOSIS Your health care provider can help you determine the allergen or allergens that trigger your symptoms. If you and your health care provider are unable to determine the allergen, skin or blood testing may be used. Your health care provider will diagnose your condition after taking your health history and performing a physical exam. Your health care provider may assess you for other related conditions, such as asthma, pink eye, or an ear infection. TREATMENT Allergic rhinitis does not have a cure, but it can be controlled by:  Medicines that block allergy symptoms. These may include allergy shots, nasal sprays, and oral antihistamines.  Avoiding the allergen. Hay fever may often be treated with antihistamines in pill or nasal spray forms. Antihistamines block the effects of histamine. There are over-the-counter medicines that may help with nasal congestion and swelling around the eyes. Check with your health care provider before taking or giving this medicine. If avoiding the allergen or the medicine prescribed do not work, there are many new medicines  your health care provider can prescribe. Stronger medicine may be used if initial measures are ineffective. Desensitizing injections can be used if medicine and avoidance does not work. Desensitization is when a patient is given ongoing shots until the body becomes less sensitive to the allergen. Make sure you follow up with your health care provider if problems continue. HOME CARE INSTRUCTIONS It is not possible to completely avoid allergens, but you can reduce your symptoms by taking steps to limit your exposure to them. It helps to know exactly what you are allergic to so that you can avoid your specific triggers. SEEK MEDICAL CARE IF:  You have a fever.  You develop a cough that does not stop easily (persistent).  You have shortness of breath.  You start wheezing.  Symptoms interfere with normal daily activities.   This information is not intended to replace advice given to you by your health care provider. Make sure you discuss any questions you have with your health care provider.   Document Released: 02/05/2001 Document Revised: 06/03/2014 Document Reviewed: 01/18/2013 Elsevier Interactive Patient Education 2016 [employer]  

## 2015-05-25 NOTE — Progress Notes (Signed)
Pre visit review using our clinic review tool, if applicable. No additional management support is needed unless otherwise documented below in the visit note. 

## 2015-05-25 NOTE — Progress Notes (Addendum)
HPI  Pt presents to the clinic today with c/o itchy, watery eyes, runny nose, scratchy throat and cough. This started 4-5 days ago. She is blowing clear mucous out of her nose. She has been sneezing. The cough is non productive. She denies fever, chills, body aches or shortness of breath. She has tried Zyrtec with minimal relief. She has no history of seasonal allergies or breathing problems. She has not had sick contacts that she is aware of.  Review of Systems      Past Medical History  Diagnosis Date  . Thyroid disease     Family History  Problem Relation Age of Onset  . Hypertension Father   . Breast cancer Maternal Aunt   . Colon cancer Maternal Grandmother   . Breast cancer Paternal Aunt     Social History   Social History  . Marital Status: Single    Spouse Name: N/A  . Number of Children: N/A  . Years of Education: N/A   Occupational History  . Not on file.   Social History Main Topics  . Smoking status: Never Smoker   . Smokeless tobacco: Never Used  . Alcohol Use: No  . Drug Use: No  . Sexual Activity: Yes    Birth Control/ Protection: Surgical     Comment: btl   Other Topics Concern  . Not on file   Social History Narrative    No Known Allergies   Constitutional: Positive headache. Denies fever, fatigue or abrupt weight changes.  HEENT:  Positive facial pain, nasal congestion, sore throat. Denies eye redness, eye pain, pressure behind the eyes,ear pain, ringing in the ears, wax buildup, runny nose or bloody nose. Respiratory: Positive cough. Denies difficulty breathing or shortness of breath.  Cardiovascular: Denies chest pain, chest tightness, palpitations or swelling in the hands or feet.   No other specific complaints in a complete review of systems (except as listed in HPI above).  Objective:   BP 116/72 mmHg  Pulse 97  Temp(Src) 97.8 F (36.6 C) (Oral)  Wt 204 lb (92.534 kg)  SpO2 98% Wt Readings from Last 3 Encounters:  05/25/15 204  lb (92.534 kg)  06/26/13 203 lb 8 oz (92.307 kg)  06/15/13 202 lb (91.627 kg)     General: Appears her stated age, in NAD. HEENT: Head: normal shape and size, no sinus tenderness noted; Eyes: sclera white, no icterus, conjunctiva pink; Ears: bilateral cerumen impaction; Nose: mucosa boggy and moist, septum midline; Throat/Mouth: + PND. Teeth present, mucosa erythematous and moist, no exudate noted, no lesions or ulcerations noted.  Neck: No cervical lymphadenopathy.  Cardiovascular: Normal rate and rhythm. S1,S2 noted.  No murmur, rubs or gallops noted.  Pulmonary/Chest: Normal effort and positive vesicular breath sounds. No respiratory distress. No wheezes, rales or ronchi noted.      Assessment & Plan:   Allergic Rhinitis:  Get some rest and drink plenty of water Continue Zyrtec Start Flonase 1 spray each nostril, nightly x 1 week 80 mg Depo IM today Return precautions given  RTC as needed or if symptoms persist.

## 2015-05-25 NOTE — Addendum Note (Signed)
Addended by: Roena MaladyEVONTENNO, Myrical Andujo Y on: 05/25/2015 05:12 PM   Modules accepted: Orders

## 2015-08-11 ENCOUNTER — Other Ambulatory Visit: Payer: Self-pay | Admitting: Internal Medicine

## 2015-09-14 ENCOUNTER — Encounter: Payer: Self-pay | Admitting: Internal Medicine

## 2015-09-14 ENCOUNTER — Ambulatory Visit (INDEPENDENT_AMBULATORY_CARE_PROVIDER_SITE_OTHER): Payer: 59 | Admitting: Internal Medicine

## 2015-09-14 ENCOUNTER — Ambulatory Visit (INDEPENDENT_AMBULATORY_CARE_PROVIDER_SITE_OTHER)
Admission: RE | Admit: 2015-09-14 | Discharge: 2015-09-14 | Disposition: A | Discharge status: Home / Self Care | Payer: 59 | Source: Ambulatory Visit | Attending: Internal Medicine | Admitting: Internal Medicine

## 2015-09-14 VITALS — BP 100/68 | HR 92 | Temp 98.5°F | Resp 16 | Ht 66.0 in | Wt 205.0 lb

## 2015-09-14 DIAGNOSIS — M545 Low back pain, unspecified: Secondary | ICD-10-CM | POA: Insufficient documentation

## 2015-09-14 MED ORDER — NAPROXEN 500 MG PO TABS: 500.0000 mg | ORAL_TABLET | Freq: Two times a day (BID) | ORAL | Status: DC

## 2015-09-14 NOTE — Patient Instructions (Signed)

## 2015-09-14 NOTE — Progress Notes (Signed)
Pre visit review using our clinic review tool, if applicable. No additional management support is needed unless otherwise documented below in the visit note. 

## 2015-09-16 ENCOUNTER — Encounter: Payer: Self-pay | Admitting: Internal Medicine
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
## 2015-11-30 ENCOUNTER — Other Ambulatory Visit: Payer: Self-pay | Admitting: Internal Medicine

## 2016-03-03 ENCOUNTER — Other Ambulatory Visit: Payer: Self-pay | Admitting: Internal Medicine

## 2017-01-01 ENCOUNTER — Telehealth: Payer: Self-pay

## 2017-01-01 NOTE — Telephone Encounter (Signed)
Pt has not been seen in some time. Can you call and see if patient can come in for a CPE

## 2017-12-12 ENCOUNTER — Ambulatory Visit (INDEPENDENT_AMBULATORY_CARE_PROVIDER_SITE_OTHER): Payer: 59 | Admitting: Family

## 2017-12-12 ENCOUNTER — Encounter: Payer: Self-pay | Admitting: Family

## 2017-12-12 ENCOUNTER — Ambulatory Visit: Payer: 59 | Admitting: Family

## 2017-12-12 ENCOUNTER — Other Ambulatory Visit (INDEPENDENT_AMBULATORY_CARE_PROVIDER_SITE_OTHER): Payer: 59

## 2017-12-12 VITALS — BP 116/76 | HR 90 | Temp 97.6°F | Ht 66.0 in | Wt 204.1 lb

## 2017-12-12 DIAGNOSIS — K219 Gastro-esophageal reflux disease without esophagitis: Secondary | ICD-10-CM | POA: Diagnosis not present

## 2017-12-12 DIAGNOSIS — Z13220 Encounter for screening for lipoid disorders: Secondary | ICD-10-CM | POA: Diagnosis not present

## 2017-12-12 DIAGNOSIS — Z23 Encounter for immunization: Secondary | ICD-10-CM | POA: Diagnosis not present

## 2017-12-12 DIAGNOSIS — R5383 Other fatigue: Secondary | ICD-10-CM | POA: Diagnosis not present

## 2017-12-12 LAB — CBC WITH DIFFERENTIAL/PLATELET
BASOS ABS: 0.1 10*3/uL (ref 0.0–0.1)
Basophils Relative: 2.6 % (ref 0.0–3.0)
EOS ABS: 0 10*3/uL (ref 0.0–0.7)
Eosinophils Relative: 0.9 % (ref 0.0–5.0)
HEMATOCRIT: 41.8 % (ref 36.0–46.0)
HEMOGLOBIN: 14.4 g/dL (ref 12.0–15.0)
LYMPHS PCT: 51.5 % — AB (ref 12.0–46.0)
Lymphs Abs: 1.5 10*3/uL (ref 0.7–4.0)
MCHC: 34.6 g/dL (ref 30.0–36.0)
MCV: 83.6 fl (ref 78.0–100.0)
MONO ABS: 0.2 10*3/uL (ref 0.1–1.0)
Monocytes Relative: 5.3 % (ref 3.0–12.0)
Neutro Abs: 1.2 10*3/uL — ABNORMAL LOW (ref 1.4–7.7)
Neutrophils Relative %: 39.7 % — ABNORMAL LOW (ref 43.0–77.0)
Platelets: 320 10*3/uL (ref 150.0–400.0)
RBC: 4.99 Mil/uL (ref 3.87–5.11)
RDW: 13.7 % (ref 11.5–15.5)
WBC: 3 10*3/uL — AB (ref 4.0–10.5)

## 2017-12-12 LAB — TSH: TSH: 9.26 u[IU]/mL — ABNORMAL HIGH (ref 0.35–4.50)

## 2017-12-12 LAB — LIPID PANEL
CHOLESTEROL: 286 mg/dL — AB (ref 0–200)
HDL: 72 mg/dL (ref >39.00)
LDL Cholesterol: 198 mg/dL — ABNORMAL HIGH (ref 0–99)
NonHDL: 213.8
Total CHOL/HDL Ratio: 4
Triglycerides: 78 mg/dL (ref 0.0–149.0)
VLDL: 15.6 mg/dL (ref 0.0–40.0)

## 2017-12-12 LAB — COMPREHENSIVE METABOLIC PANEL
ALBUMIN: 4.6 g/dL (ref 3.5–5.2)
ALK PHOS: 45 U/L (ref 39–117)
ALT: 23 U/L (ref 0–35)
AST: 19 U/L (ref 0–37)
BILIRUBIN TOTAL: 0.5 mg/dL (ref 0.2–1.2)
BUN: 7 mg/dL (ref 6–23)
CALCIUM: 9.3 mg/dL (ref 8.4–10.5)
CO2: 30 mEq/L (ref 19–32)
Chloride: 104 mEq/L (ref 96–112)
Creatinine, Ser: 0.89 mg/dL (ref 0.40–1.20)
GFR: 85.9 mL/min (ref >60.00)
Glucose, Bld: 87 mg/dL (ref 70–99)
Potassium: 3.8 mEq/L (ref 3.5–5.1)
SODIUM: 139 meq/L (ref 135–145)
TOTAL PROTEIN: 7.6 g/dL (ref 6.0–8.3)

## 2017-12-12 MED ORDER — PANTOPRAZOLE SODIUM 40 MG PO TBEC: 40.0000 mg | DELAYED_RELEASE_TABLET | Freq: Every day | ORAL | 1 refills | Status: DC

## 2017-12-12 NOTE — Patient Instructions (Signed)
Food Choices for Gastroesophageal Reflux Disease, Adult When you have gastroesophageal reflux disease (GERD), the foods you eat and your eating habits are very important. Choosing the right foods can help ease your discomfort. What guidelines do I need to follow?  Choose fruits, vegetables, whole grains, and low-fat dairy products.  Choose low-fat meat, fish, and poultry.  Limit fats such as oils, salad dressings, butter, nuts, and avocado.  Keep a food diary. This helps you identify foods that cause symptoms.  Avoid foods that cause symptoms. These may be different for everyone.  Eat small meals often instead of 3 large meals a day.  Eat your meals slowly, in a place where you are relaxed.  Limit fried foods.  Cook foods using methods other than frying.  Avoid drinking alcohol.  Avoid drinking large amounts of liquids with your meals.  Avoid bending over or lying down until 2-3 hours after eating. What foods are not recommended? These are some foods and drinks that may make your symptoms worse: Vegetables  Tomatoes. Tomato juice. Tomato and spaghetti sauce. Chili peppers. Onion and garlic. Horseradish. Fruits  Oranges, grapefruit, and lemon (fruit and juice). Meats  High-fat meats, fish, and poultry. This includes hot dogs, ribs, ham, sausage, salami, and bacon. Dairy  Whole milk and chocolate milk. Sour cream. Cream. Butter. Ice cream. Cream cheese. Drinks  Coffee and tea. Bubbly (carbonated) drinks or energy drinks. Condiments  Hot sauce. Barbecue sauce. Sweets/Desserts  Chocolate and cocoa. Donuts. Peppermint and spearmint. Fats and Oils  High-fat foods. This includes French fries and potato chips. Other  Vinegar. Strong spices. This includes black pepper, white pepper, red pepper, cayenne, curry powder, cloves, ginger, and chili powder. The items listed above may not be a complete list of foods and drinks to avoid. Contact your dietitian for more information.    This information is not intended to replace advice given to you by your health care provider. Make sure you discuss any questions you have with your health care provider. Document Released: 11/12/2011 Document Revised: 10/19/2015 Document Reviewed: 03/17/2013 Elsevier Interactive Patient Education  2017 Elsevier Inc[employer]  
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
## 2017-12-15 ENCOUNTER — Other Ambulatory Visit: Payer: Self-pay | Admitting: Family

## 2017-12-15 DIAGNOSIS — E049 Nontoxic goiter, unspecified: Secondary | ICD-10-CM

## 2017-12-15 MED ORDER — LEVOTHYROXINE SODIUM 50 MCG PO TABS: 50.0000 ug | ORAL_TABLET | Freq: Every day | ORAL | 0 refills | Status: DC

## 2017-12-23 ENCOUNTER — Ambulatory Visit
Admission: RE | Admit: 2017-12-23 | Discharge: 2017-12-23 | Disposition: A | Discharge status: Home / Self Care | Payer: 59 | Source: Ambulatory Visit | Attending: Family | Admitting: Family

## 2017-12-23 DIAGNOSIS — E049 Nontoxic goiter, unspecified: Secondary | ICD-10-CM

## 2018-01-19 ENCOUNTER — Telehealth: Payer: Self-pay

## 2018-01-19 NOTE — Telephone Encounter (Signed)
Pharmacy sent over request for refill on her [facility]oprazole Sod to [facility] [address].  Please advise regarding refill.  Thanks

## 2018-01-20 ENCOUNTER — Other Ambulatory Visit: Payer: Self-pay | Admitting: Family

## 2018-01-20 MED ORDER — PANTOPRAZOLE SODIUM 40 MG PO TBEC: 40.0000 mg | DELAYED_RELEASE_TABLET | Freq: Every day | ORAL | 1 refills | Status: DC

## 2018-01-20 NOTE — Telephone Encounter (Signed)
I sent in the refill; needs to keep her appt for September to re-check her thyroid.

## 2018-01-20 NOTE — Telephone Encounter (Signed)
Emailed patient today via my-chart to let her know protonix had been refilled and to have her levels rechecked next month.

## 2018-03-10 ENCOUNTER — Other Ambulatory Visit: Payer: Self-pay | Admitting: Family

## 2018-03-10 MED ORDER — PANTOPRAZOLE SODIUM 40 MG PO TBEC: 40.0000 mg | DELAYED_RELEASE_TABLET | Freq: Every day | ORAL | 0 refills | Status: DC

## 2018-03-24 LAB — HM PAP SMEAR

## 2018-03-25 ENCOUNTER — Encounter: Payer: Self-pay | Admitting: Family

## 2019-05-18 ENCOUNTER — Other Ambulatory Visit: Payer: 59

## 2019-05-19 ENCOUNTER — Other Ambulatory Visit: Payer: Self-pay

## 2019-05-19 ENCOUNTER — Encounter: Payer: Self-pay | Admitting: Family

## 2019-05-19 ENCOUNTER — Ambulatory Visit (INDEPENDENT_AMBULATORY_CARE_PROVIDER_SITE_OTHER): Payer: 59 | Admitting: Family

## 2019-05-19 VITALS — BP 124/80 | HR 85 | Temp 98.1°F | Ht 66.0 in | Wt 210.8 lb

## 2019-05-19 DIAGNOSIS — Z23 Encounter for immunization: Secondary | ICD-10-CM | POA: Diagnosis not present

## 2019-05-19 DIAGNOSIS — R102 Pelvic and perineal pain: Secondary | ICD-10-CM

## 2019-05-19 DIAGNOSIS — K219 Gastro-esophageal reflux disease without esophagitis: Secondary | ICD-10-CM

## 2019-05-19 DIAGNOSIS — R109 Unspecified abdominal pain: Secondary | ICD-10-CM

## 2019-05-19 DIAGNOSIS — E039 Hypothyroidism, unspecified: Secondary | ICD-10-CM | POA: Diagnosis not present

## 2019-05-19 LAB — CBC WITH DIFFERENTIAL/PLATELET
Basophils Absolute: 0 10*3/uL (ref 0.0–0.1)
Basophils Relative: 0.3 % (ref 0.0–3.0)
Eosinophils Absolute: 0 10*3/uL (ref 0.0–0.7)
Eosinophils Relative: 1.3 % (ref 0.0–5.0)
HCT: 39.4 % (ref 36.0–46.0)
Hemoglobin: 13.1 g/dL (ref 12.0–15.0)
Lymphocytes Relative: 46.3 % — ABNORMAL HIGH (ref 12.0–46.0)
Lymphs Abs: 1.7 10*3/uL (ref 0.7–4.0)
MCHC: 33.2 g/dL (ref 30.0–36.0)
MCV: 83.8 fl (ref 78.0–100.0)
Monocytes Absolute: 0.3 10*3/uL (ref 0.1–1.0)
Monocytes Relative: 7.3 % (ref 3.0–12.0)
Neutro Abs: 1.6 10*3/uL (ref 1.4–7.7)
Neutrophils Relative %: 44.8 % (ref 43.0–77.0)
Platelets: 375 10*3/uL (ref 150.0–400.0)
RBC: 4.7 Mil/uL (ref 3.87–5.11)
RDW: 13.5 % (ref 11.5–15.5)
WBC: 3.6 10*3/uL — ABNORMAL LOW (ref 4.0–10.5)

## 2019-05-19 LAB — COMPREHENSIVE METABOLIC PANEL
ALT: 15 U/L (ref 0–35)
AST: 15 U/L (ref 0–37)
Albumin: 4.2 g/dL (ref 3.5–5.2)
Alkaline Phosphatase: 49 U/L (ref 39–117)
BUN: 7 mg/dL (ref 6–23)
CO2: 29 mEq/L (ref 19–32)
Calcium: 9 mg/dL (ref 8.4–10.5)
Chloride: 104 mEq/L (ref 96–112)
Creatinine, Ser: 0.83 mg/dL (ref 0.40–1.20)
GFR: 87.11 mL/min (ref >60.00)
Glucose, Bld: 89 mg/dL (ref 70–99)
Potassium: 3.7 mEq/L (ref 3.5–5.1)
Sodium: 139 mEq/L (ref 135–145)
Total Bilirubin: 0.3 mg/dL (ref 0.2–1.2)
Total Protein: 7.1 g/dL (ref 6.0–8.3)

## 2019-05-19 LAB — URINALYSIS, ROUTINE W REFLEX MICROSCOPIC
Bilirubin Urine: NEGATIVE
Ketones, ur: NEGATIVE
Nitrite: NEGATIVE
Specific Gravity, Urine: >=1.03 — AB (ref 1.000–1.030)
Total Protein, Urine: NEGATIVE
Urine Glucose: NEGATIVE
Urobilinogen, UA: 1 (ref 0.0–1.0)
pH: 6 (ref 5.0–8.0)

## 2019-05-19 MED ORDER — PANTOPRAZOLE SODIUM 40 MG PO TBEC: 40.0000 mg | DELAYED_RELEASE_TABLET | Freq: Every day | ORAL | 3 refills | Status: AC

## 2019-05-19 NOTE — Addendum Note (Signed)
Addended by: Marcina Millard on: 05/19/2019 10:42 AM   Modules accepted: Orders
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
## 2019-05-20 LAB — URINE CULTURE: Result:: NO GROWTH

## 2019-06-04 ENCOUNTER — Ambulatory Visit
Admission: RE | Admit: 2019-06-04 | Discharge: 2019-06-04 | Disposition: A | Discharge status: Home / Self Care | Payer: 59 | Source: Ambulatory Visit | Attending: Family | Admitting: Family

## 2019-06-04 ENCOUNTER — Ambulatory Visit
Admission: RE | Admit: 2019-06-04 | Discharge: 2019-06-04 | Disposition: A | Discharge status: Home / Self Care | Payer: BC Managed Care – PPO | Source: Ambulatory Visit | Attending: Family | Admitting: Family

## 2019-06-04 DIAGNOSIS — D259 Leiomyoma of uterus, unspecified: Secondary | ICD-10-CM | POA: Diagnosis not present

## 2019-06-04 DIAGNOSIS — R1032 Left lower quadrant pain: Secondary | ICD-10-CM | POA: Diagnosis not present

## 2019-06-04 DIAGNOSIS — R102 Pelvic and perineal pain: Secondary | ICD-10-CM

## 2019-06-04 DIAGNOSIS — R109 Unspecified abdominal pain: Secondary | ICD-10-CM

## 2019-06-30 DIAGNOSIS — N83201 Unspecified ovarian cyst, right side: Secondary | ICD-10-CM | POA: Diagnosis not present

## 2019-06-30 DIAGNOSIS — R1032 Left lower quadrant pain: Secondary | ICD-10-CM | POA: Diagnosis not present

## 2019-06-30 DIAGNOSIS — D259 Leiomyoma of uterus, unspecified: Secondary | ICD-10-CM | POA: Diagnosis not present

## 2019-09-06 DIAGNOSIS — N83201 Unspecified ovarian cyst, right side: Secondary | ICD-10-CM | POA: Diagnosis not present

## 2019-09-06 DIAGNOSIS — D259 Leiomyoma of uterus, unspecified: Secondary | ICD-10-CM | POA: Diagnosis not present

## 2019-09-09 ENCOUNTER — Ambulatory Visit: Payer: BC Managed Care – PPO | Attending: Internal Medicine

## 2019-09-09 DIAGNOSIS — Z20822 Contact with and (suspected) exposure to covid-19: Secondary | ICD-10-CM

## 2019-09-10 LAB — SARS-COV-2, NAA 2 DAY TAT

## 2019-09-10 LAB — NOVEL CORONAVIRUS, NAA: SARS-CoV-2, NAA: NOT DETECTED

## 2020-01-15 IMAGING — US US THYROID
1 series · 13 of 25 positions shown · non-contrast
Comparison: 05/05/2003

CLINICAL DATA: Thyromegaly on physical exam . Previous radioiodine
treatment.

EXAM:
THYROID ULTRASOUND
TECHNIQUE: Ultrasound examination of the thyroid gland and adjacent soft
tissues was performed.

[Series 1: us thyroid · 0.04mm/px · 13 of 53 slices shown]
[im 1/53]
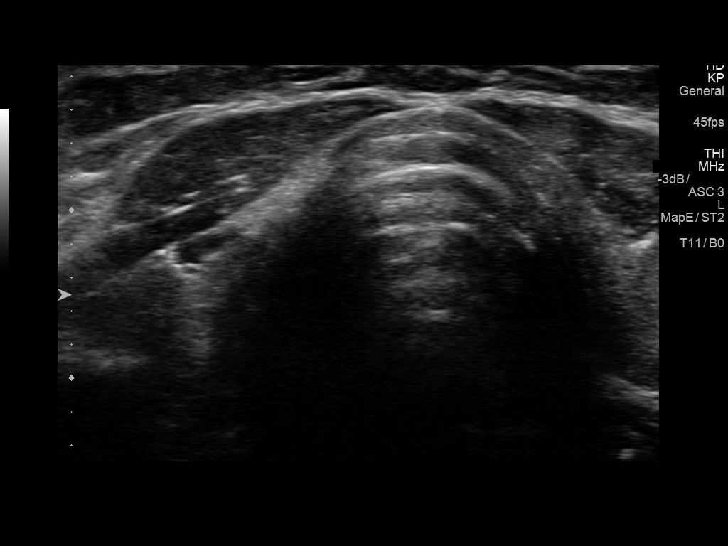
[im 5/53]
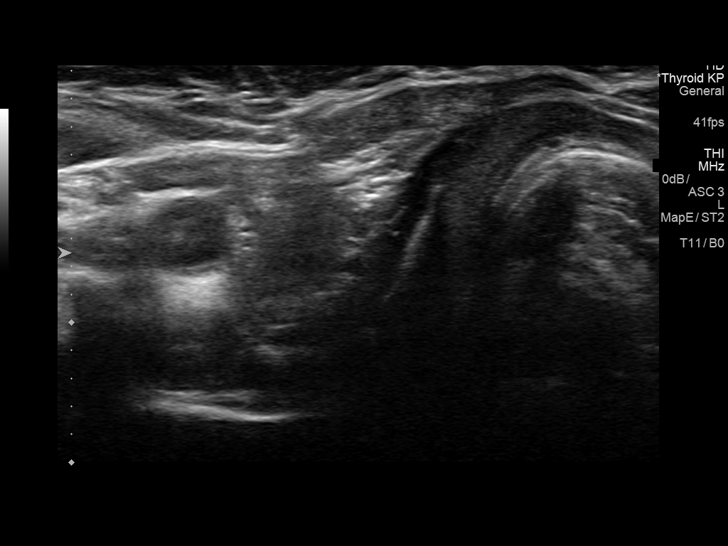
[im 9/53]
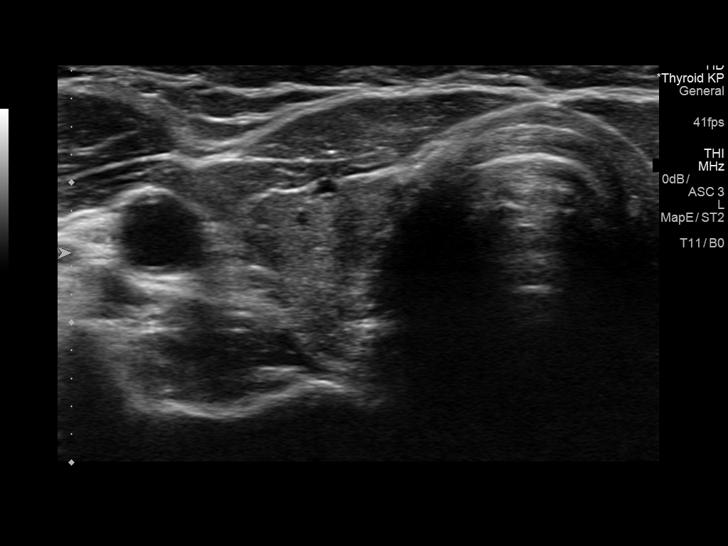
[im 14/53]
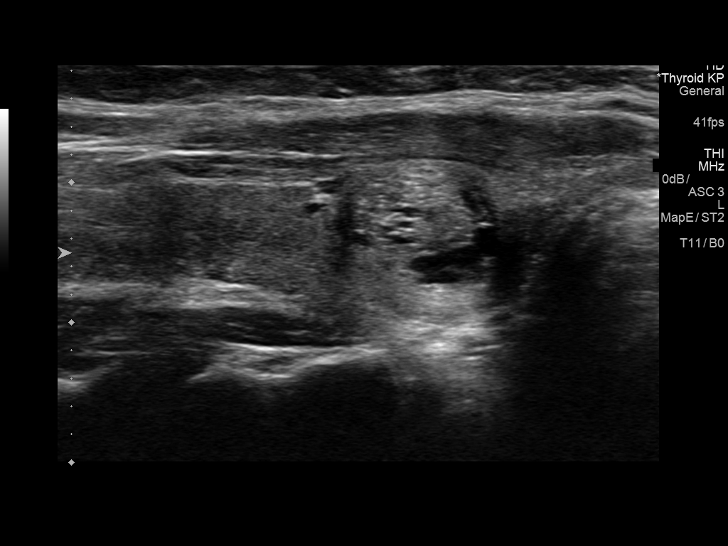
[im 18/53]
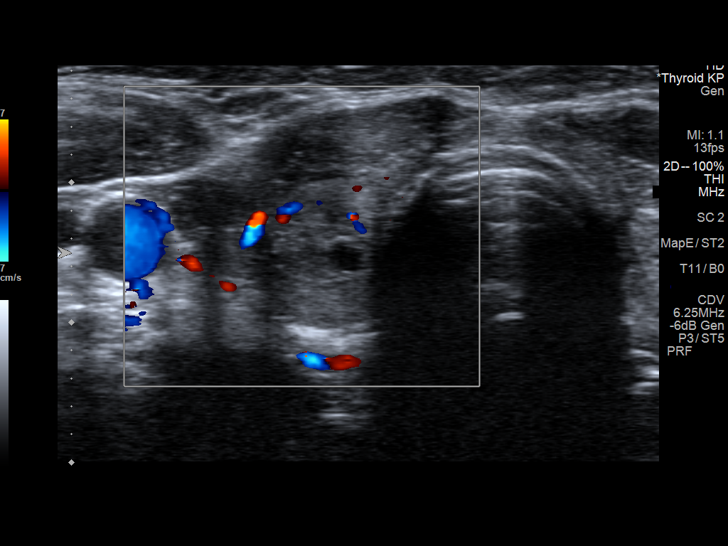
[im 22/53]
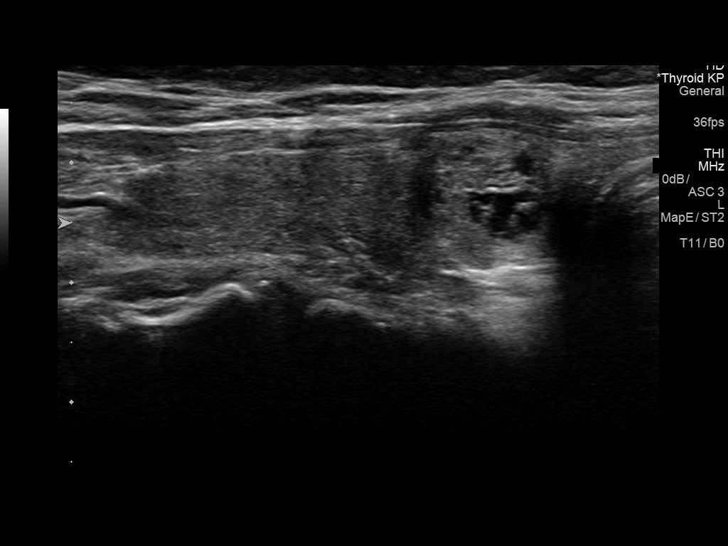
[im 27/53]
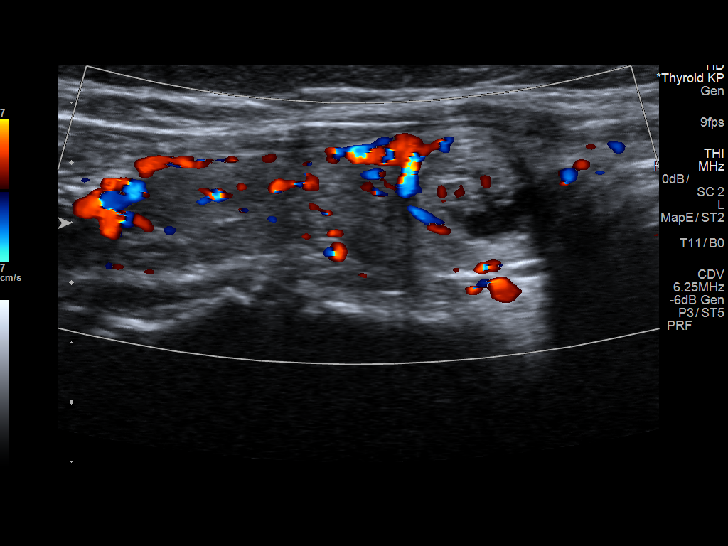
[im 31/53]
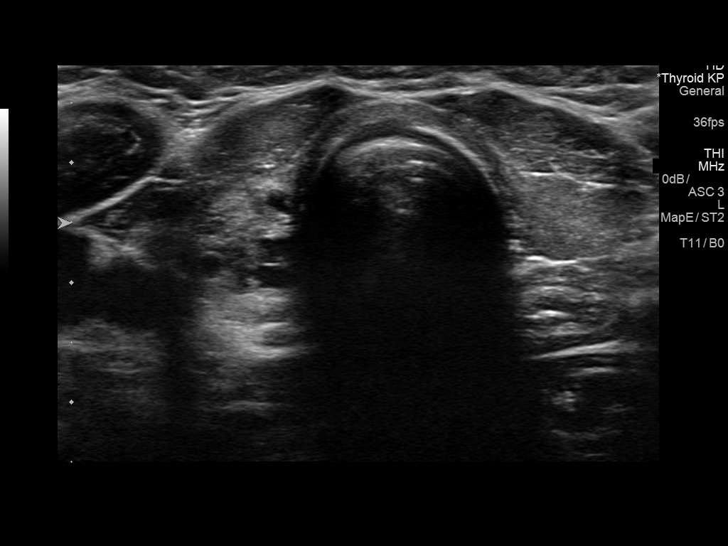
[im 35/53]
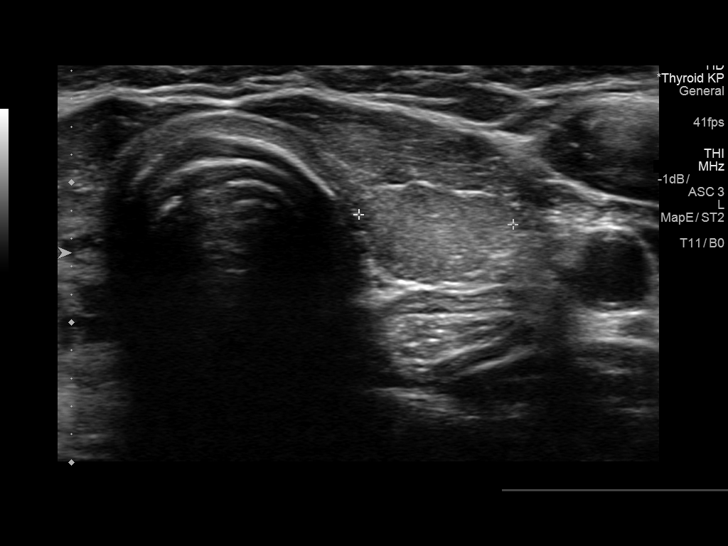
[im 40/53]
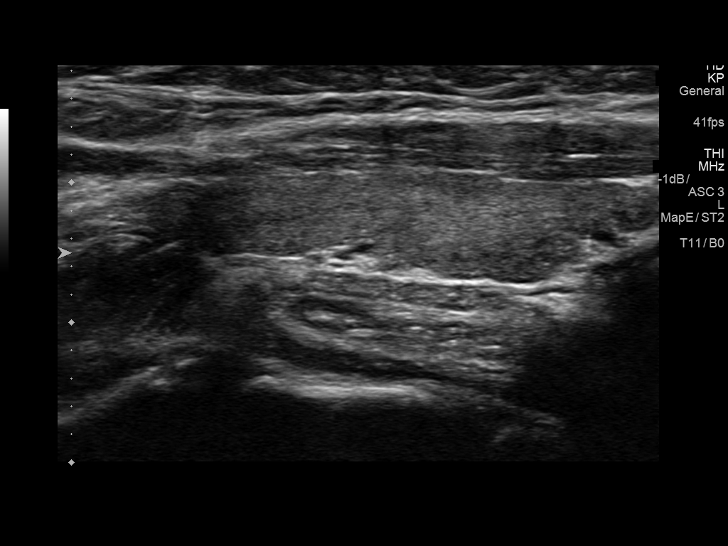
[im 44/53]
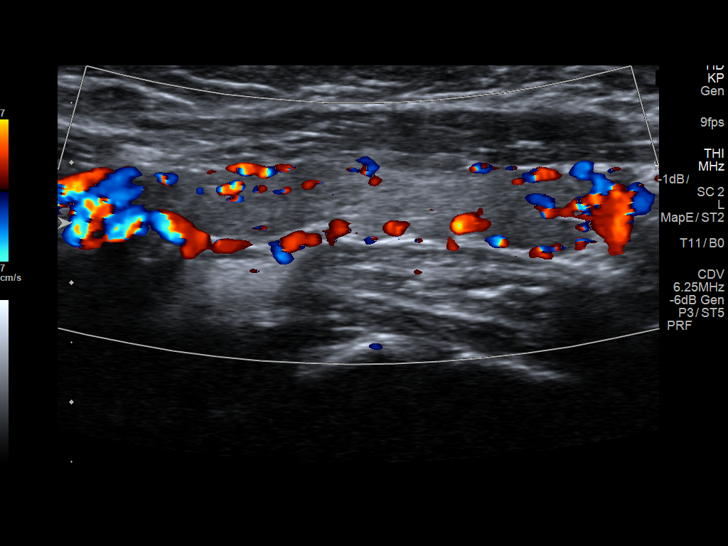
[im 48/53]
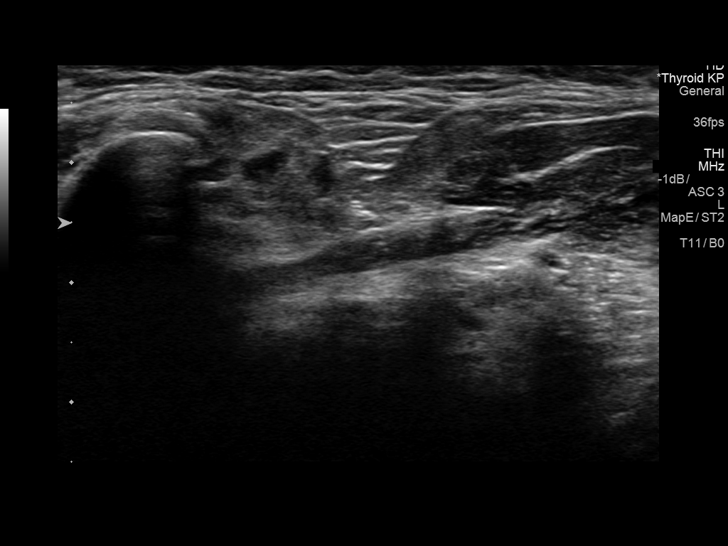
[im 53/53]
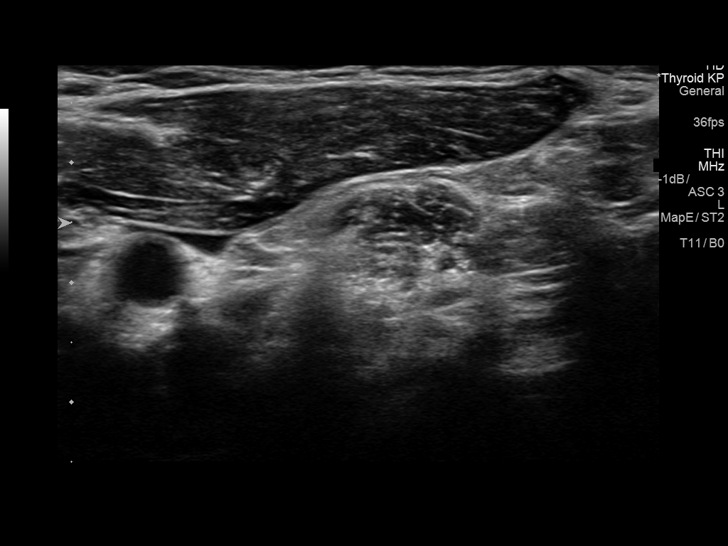

[13 of 25 positions shown; findings below may reference images not displayed]

FINDINGS: Parenchymal Echotexture: Mildly heterogenous

Isthmus: 0.2 cm thickness, previously 1.1 cm

Right lobe: 4.6 x 1.1 x 1.2 cm, previously 6.8 x 2.8 x

Left lobe: 4.1 x 0.8 x 1.1 cm, previously 6.3 x 2.5 x

_________________________________________________________

Estimated total number of nodules >/= 1 cm: 1

Number of spongiform nodules >/=  2 cm not described below (TR1): 0

Number of mixed cystic and solid nodules >/= 1.5 cm not described
below (TR2): 0

_________________________________________________________

Nodule # 1:

Prior biopsy: No

Location: Right; Inferior

Maximum size: 1.1 cm; Other 2 dimensions: 0.9 x 1 cm, previously,
1.1 x 0.8 x 0.9 cm

Composition: mixed cystic and solid (1)

Echogenicity: isoechoic (1)

Shape: not taller-than-wide (0)

Margins: ill-defined (0)

Echogenic foci: none (0)

ACR TI-RADS total points: 2.

ACR TI-RADS risk category:  TR2 (2 points).

Significant change in size (>/= 20% in two dimensions and minimal
increase of 2 mm): No

Change in features: No

Change in ACR TI-RADS risk category: No

ACR TI-RADS recommendations:

Stability for over 5 years implies benignity. No biopsy or dedicated
imaging follow-up is indicated.

_________________________________________________________
IMPRESSION: 1. Interval resolution of thyromegaly.
2. Stable inferior right nodule. Stability for greater than 5 years
implies benignity; no biopsy or imaging follow-up indicated.

The above is in keeping with the ACR TI-RADS recommendations - [HOSPITAL] 2694;[DATE].

## 2020-02-03 NOTE — Progress Notes (Signed)
Virtual Visit via Video Note  I connected with Bianca Marquez on 02/03/20 at  2:15 PM EDT by a video enabled telemedicine application and verified that I am speaking with the correct person using two identifiers.   I discussed the limitations of evaluation and management by telemedicine and the availability of in person appointments. The patient expressed understanding and agreed to proceed.  Present for the visit:  Myself, Dr Cheryll Cockayne, Bianca Marquez.  The patient is currently at home and I am in the office.    No referring provider.    History of Present Illness: This is an acute visit for headache and sore throat.  Her symptoms started two days ago.  She was sneezing, had a sore throat and had a little fever.  She started to have a cough yesterday. She states some sinus pressure and drainage.     Her daughter had a stomach virus recently. She denies covid exposure.     Taking ibuprofen.    Review of Systems  Constitutional: Positive for chills, fever (101) and malaise/fatigue (yesterday).  HENT: Positive for ear pain (one ear), sinus pain and sore throat.        Sneezing, hoarseness  Respiratory: Positive for cough and sputum production. Negative for shortness of breath and wheezing.   Gastrointestinal: Negative for diarrhea and nausea.  Musculoskeletal: Positive for myalgias (yesterday).  Neurological: Negative for dizziness and headaches.      Social History   Socioeconomic History  . Marital status: Single    Spouse name: Not on file  . Number of children: Not on file  . Years of education: Not on file  . Highest education level: Not on file  Occupational History  . Not on file  Tobacco Use  . Smoking status: Never Smoker  . Smokeless tobacco: Never Used  Substance and Sexual Activity  . Alcohol use: No  . Drug use: No  . Sexual activity: Yes    Birth control/protection: Surgical    Comment: btl  Other Topics Concern  . Not on file  Social History  Narrative  . Not on file   Social Determinants of Health   Financial Resource Strain:   . Difficulty of Paying Living Expenses: Not on file  Food Insecurity:   . Worried About Programme researcher, broadcasting/film/video in the Last Year: Not on file  . Ran Out of Food in the Last Year: Not on file  Transportation Needs:   . Lack of Transportation (Medical): Not on file  . Lack of Transportation (Non-Medical): Not on file  Physical Activity:   . Days of Exercise per Week: Not on file  . Minutes of Exercise per Session: Not on file  Stress:   . Feeling of Stress : Not on file  Social Connections:   . Frequency of Communication with Friends and Family: Not on file  . Frequency of Social Gatherings with Friends and Family: Not on file  . Attends Religious Services: Not on file  . Active Member of Clubs or Organizations: Not on file  . Attends Banker Meetings: Not on file  . Marital Status: Not on file     Observations/Objective: Appears well in NAD Breathing normally Skin appears warm and dry  Assessment and Plan:  See Problem List for Assessment and Plan of chronic medical problems.   Follow Up Instructions:    I discussed the assessment and treatment plan with the patient. The patient was provided an opportunity to ask questions and  all were answered. The patient agreed with the plan and demonstrated an understanding of the instructions.   The patient was advised to call back or seek an in-person evaluation if the symptoms worsen or if the condition fails to improve as anticipated.    Pincus Sanes, MD

## 2020-02-04 ENCOUNTER — Encounter: Payer: Self-pay | Admitting: Internal Medicine

## 2020-02-04 ENCOUNTER — Telehealth (INDEPENDENT_AMBULATORY_CARE_PROVIDER_SITE_OTHER): Payer: BC Managed Care – PPO | Admitting: Internal Medicine

## 2020-02-04 DIAGNOSIS — J019 Acute sinusitis, unspecified: Secondary | ICD-10-CM | POA: Diagnosis not present

## 2020-02-04 MED ORDER — AMOXICILLIN-POT CLAVULANATE 875-125 MG PO TABS: 1.0000 | ORAL_TABLET | Freq: Two times a day (BID) | ORAL | 0 refills | Status: DC

## 2020-02-04 NOTE — Assessment & Plan Note (Addendum)
Likely bacterial  Start augmentin otc cold/ allergy medications Rest, fluid Advised if no improvement or symptoms worsen to get tested for covid

## 2020-03-22 DIAGNOSIS — E039 Hypothyroidism, unspecified: Secondary | ICD-10-CM | POA: Diagnosis not present

## 2020-03-27 DIAGNOSIS — Z23 Encounter for immunization: Secondary | ICD-10-CM | POA: Diagnosis not present

## 2020-03-27 DIAGNOSIS — E039 Hypothyroidism, unspecified: Secondary | ICD-10-CM | POA: Diagnosis not present

## 2020-05-16 DIAGNOSIS — Z3040 Encounter for surveillance of contraceptives, unspecified: Secondary | ICD-10-CM | POA: Diagnosis not present

## 2020-05-16 DIAGNOSIS — Z1211 Encounter for screening for malignant neoplasm of colon: Secondary | ICD-10-CM | POA: Diagnosis not present

## 2020-05-16 DIAGNOSIS — Z01419 Encounter for gynecological examination (general) (routine) without abnormal findings: Secondary | ICD-10-CM | POA: Diagnosis not present

## 2020-05-16 DIAGNOSIS — Z1231 Encounter for screening mammogram for malignant neoplasm of breast: Secondary | ICD-10-CM | POA: Diagnosis not present

## 2020-06-20 DIAGNOSIS — Z20822 Contact with and (suspected) exposure to covid-19: Secondary | ICD-10-CM | POA: Diagnosis not present

## 2020-08-15 ENCOUNTER — Encounter: Payer: Self-pay | Admitting: Internal Medicine

## 2020-08-15 ENCOUNTER — Other Ambulatory Visit: Payer: Self-pay

## 2020-08-15 ENCOUNTER — Ambulatory Visit (INDEPENDENT_AMBULATORY_CARE_PROVIDER_SITE_OTHER): Payer: BC Managed Care – PPO | Admitting: Internal Medicine

## 2020-08-15 DIAGNOSIS — H1013 Acute atopic conjunctivitis, bilateral: Secondary | ICD-10-CM

## 2020-08-15 MED ORDER — OLOPATADINE HCL 0.1 % OP SOLN: 1.0000 [drp] | Freq: Two times a day (BID) | OPHTHALMIC | 2 refills | Status: AC

## 2020-08-15 NOTE — Patient Instructions (Addendum)
We have sent in the eye drops to use 1 drop twice a day.

## 2020-08-15 NOTE — Progress Notes (Signed)
   Subjective:   Patient ID: Bianca Marquez, female    DOB: May 06, 1966, 55 y.o.   MRN: [id number]  HPI The patient is a 55 YO female coming in for swelling around her eyes, itching eyes and watering eyes. Started in the last day or so. She noticed the swelling first and then the itching. She has not used any otc for this. Denies sinus pressure or drainage. Denies fevers or chills. Denies ear pain or pressure. Denies vision change such as blurring, double, loss of vision. Does have allergies and takes zyrtec. No new soap, lotion, cream, makeup, foods. No crusting around her eyes.   Review of Systems  Constitutional: Negative.   HENT: Negative.   Eyes: Positive for redness and itching. Negative for discharge.  Respiratory: Negative for cough, chest tightness and shortness of breath.   Cardiovascular: Negative for chest pain, palpitations and leg swelling.  Gastrointestinal: Negative for abdominal distention, abdominal pain, constipation, diarrhea, nausea and vomiting.  Musculoskeletal: Negative.   Skin: Negative.   Neurological: Negative.   Psychiatric/Behavioral: Negative.     Objective:  Physical Exam Constitutional:      Appearance: She is well-developed. She is obese.  HENT:     Head: Normocephalic and atraumatic.  Eyes:     Comments: Some puffiness around the eyes bilateral, mild clear tearing of the eyes on exam. No findings consistent with infectious conjunctivitis.   Cardiovascular:     Rate and Rhythm: Normal rate and regular rhythm.  Pulmonary:     Effort: Pulmonary effort is normal. No respiratory distress.     Breath sounds: Normal breath sounds. No wheezing or rales.  Abdominal:     General: Bowel sounds are normal. There is no distension.     Palpations: Abdomen is soft.     Tenderness: There is no abdominal tenderness. There is no rebound.  Musculoskeletal:     Cervical back: Normal range of motion.  Skin:    General: Skin is warm and dry.  Neurological:      Mental Status: She is alert and oriented to person, place, and time.     Coordination: Coordination normal.     Vitals:   08/15/20 1528  BP: 118/72  Pulse: 94  Resp: 18  Temp: 98.3 F (36.8 C)  TempSrc: Oral  SpO2: 97%  Weight: 197 lb (89.4 kg)  Height: 5\' 6"  (1.676 m)    This visit occurred during the SARS-CoV-2 public health emergency.  Safety protocols were in place, including screening questions prior to the visit, additional usage of staff PPE, and extensive cleaning of exam room while observing appropriate contact time as indicated for disinfecting solutions.   Assessment & Plan:

## 2020-08-17 NOTE — Assessment & Plan Note (Signed)
Rx patanol eye drops for this likely allergic reaction. Would adjust to continue zyrtec as well.

## 2021-02-09 ENCOUNTER — Telehealth (INDEPENDENT_AMBULATORY_CARE_PROVIDER_SITE_OTHER): Payer: BC Managed Care – PPO | Admitting: Nurse Practitioner

## 2021-02-09 ENCOUNTER — Encounter: Payer: Self-pay | Admitting: Nurse Practitioner

## 2021-02-09 VITALS — BP 110/65

## 2021-02-09 DIAGNOSIS — J019 Acute sinusitis, unspecified: Secondary | ICD-10-CM | POA: Diagnosis not present

## 2021-02-09 MED ORDER — FLUTICASONE PROPIONATE 50 MCG/ACT NA SUSP: 1.0000 | Freq: Every day | NASAL | 6 refills | Status: AC

## 2021-02-09 NOTE — Progress Notes (Signed)
Due to national recommendations of social distancing related to the [place] pandemic, an audio-only tele-health visit was felt to be the most appropriate encounter type for this patient today. I connected with  Bianca Marquez on 02/09/21 utilizing audio-only technology and verified that I am speaking with the correct person using two identifiers. The patient was located in the car, and I was located at the office of [place] at [facility] during the encounter. I discussed the limitations of evaluation and management by telemedicine. The patient expressed understanding and agreed to proceed.    Subjective:  Patient ID: Bianca Marquez, female    DOB: 1965/11/23  Age: 55 y.o. MRN: [id number]  CC:  Chief Complaint  Patient presents with   Sinusitis      HPI  This patient arrives today for a virtual visit for the above.  She tells me that for the last 2 days she has been experiencing stuffy nose, sore throat, postnasal drip, and a nagging headache.  She tells me she took an at-home COVID-19 test and it was negative.  She has been vaccinated against COVID-19.  She believes she may have a sinusitis.  She is been taking ibuprofen as well as Claritin over-the-counter which is helped her symptoms mildly.  Past Medical History:  Diagnosis Date   Thyroid disease       Family History  Problem Relation Age of Onset   Hypertension Father    Breast cancer Maternal Aunt    Colon cancer Maternal Grandmother    Breast cancer Paternal Aunt     Social History   Social History Narrative   Not on file   Social History   Tobacco Use   Smoking status: Never   Smokeless tobacco: Never  Substance Use Topics   Alcohol use: No     Current Meds  Medication Sig   cetirizine (ZYRTEC) 10 MG tablet cetirizine 10 mg tablet  TAKE 1 TABLET (10 MG TOTAL) BY MOUTH 2 (TWO) TIMES DAILY.   fluticasone (FLONASE) 50 MCG/ACT nasal spray Place 1-2 sprays into both nostrils daily.    levothyroxine (SYNTHROID) 125 MCG tablet Take 125 mcg by mouth every morning.   olopatadine (PATANOL) 0.1 % ophthalmic solution Place 1 drop into both eyes 2 (two) times daily.   pantoprazole (PROTONIX) 40 MG tablet Take 1 tablet (40 mg total) by mouth daily.    ROS:  Review of Systems  Constitutional:  Positive for malaise/fatigue. Negative for chills and fever.  Respiratory:  Positive for cough. Negative for sputum production and shortness of breath.   Cardiovascular:  Negative for chest pain.  Gastrointestinal:  Negative for abdominal pain, diarrhea, nausea and vomiting.  Musculoskeletal:  Positive for myalgias.  Neurological:  Positive for headaches. Negative for dizziness.    Objective:   Today's Vitals: BP 110/65  Vitals with BMI 02/09/2021 08/15/2020 05/19/2019  Height - 5' 6"  5' 6"   Weight - 197 lbs 210 lbs 13 oz  BMI - 66.44 03.47  Systolic 425 956 387  Diastolic 65 72 80  Pulse - 94 85     Physical Exam Comprehensive physical exam not completed today as office visit was conducted remotely.  Patient sounded well over the phone.  No coughing was noted.  She sounded slightly congested.  She is able to speak in complete sentences without having to stop to breathe.  She did self palpation to maxillary sinuses and reports some tenderness to the right side as well as self palpation to  frontal sinuses and noted bilateral tenderness.  Patient was alert and oriented, and appeared to have appropriate judgment.       Assessment and Plan   1. Acute non-recurrent sinusitis, unspecified location      Plan: 1.  I agree that she most likely has sinusitis.  I did discuss that sinusitis is usually viral in origin I would recommend symptomatic management with over-the-counter ibuprofen and/or Tylenol, Claritin, Sudafed or Claritin-D as needed, and Flonase nasal spray as needed.  She was encouraged to notify us next week if symptoms worsen or persist.  I also noted that she has not had  blood work completed in quite some time so I have scheduled her for a lab appointment next week to have metabolic panel and CBC collected.  If symptoms persist into late next week may consider treating with antibiotic.  Patient is aware of recommendation and endorses her understanding.   Tests ordered Orders Placed This Encounter  Procedures   Comp Met (CMET)   CBC with Differential/Platelet      Meds ordered this encounter  Medications   fluticasone (FLONASE) 50 MCG/ACT nasal spray    Sig: Place 1-2 sprays into both nostrils daily.    Dispense:  16 g    Refill:  6    Order Specific Question:   Supervising Provider    Answer:   Binnie Rail [[id number]]    Patient to follow-up next week for lab draw will notify me if symptoms persist into next week.  Total time spent on telephone today was 17 minutes and 17 seconds.  Ailene Ards, NP

## 2021-02-13 ENCOUNTER — Other Ambulatory Visit (INDEPENDENT_AMBULATORY_CARE_PROVIDER_SITE_OTHER): Payer: BC Managed Care – PPO

## 2021-02-13 ENCOUNTER — Other Ambulatory Visit: Payer: Self-pay

## 2021-02-13 DIAGNOSIS — J019 Acute sinusitis, unspecified: Secondary | ICD-10-CM | POA: Diagnosis not present

## 2021-02-13 LAB — COMPREHENSIVE METABOLIC PANEL
ALT: 25 U/L (ref 0–35)
AST: 21 U/L (ref 0–37)
Albumin: 4.3 g/dL (ref 3.5–5.2)
Alkaline Phosphatase: 49 U/L (ref 39–117)
BUN: 9 mg/dL (ref 6–23)
CO2: 30 mEq/L (ref 19–32)
Calcium: 9.3 mg/dL (ref 8.4–10.5)
Chloride: 106 mEq/L (ref 96–112)
Creatinine, Ser: 0.77 mg/dL (ref 0.40–1.20)
GFR: 87.44 mL/min (ref >60.00)
Glucose, Bld: 78 mg/dL (ref 70–99)
Potassium: 3.9 mEq/L (ref 3.5–5.1)
Sodium: 141 mEq/L (ref 135–145)
Total Bilirubin: 0.3 mg/dL (ref 0.2–1.2)
Total Protein: 7.2 g/dL (ref 6.0–8.3)

## 2021-02-13 LAB — CBC WITH DIFFERENTIAL/PLATELET
Basophils Absolute: 0.1 10*3/uL (ref 0.0–0.1)
Basophils Relative: 2.1 % (ref 0.0–3.0)
Eosinophils Absolute: 0.1 10*3/uL (ref 0.0–0.7)
Eosinophils Relative: 1.8 % (ref 0.0–5.0)
HCT: 38.9 % (ref 36.0–46.0)
Hemoglobin: 13.2 g/dL (ref 12.0–15.0)
Lymphocytes Relative: 42.5 % (ref 12.0–46.0)
Lymphs Abs: 2 10*3/uL (ref 0.7–4.0)
MCHC: 33.9 g/dL (ref 30.0–36.0)
MCV: 83.1 fl (ref 78.0–100.0)
Monocytes Absolute: 0.3 10*3/uL (ref 0.1–1.0)
Monocytes Relative: 7.4 % (ref 3.0–12.0)
Neutro Abs: 2.1 10*3/uL (ref 1.4–7.7)
Neutrophils Relative %: 46.2 % (ref 43.0–77.0)
Platelets: 303 10*3/uL (ref 150.0–400.0)
RBC: 4.68 Mil/uL (ref 3.87–5.11)
RDW: 13.5 % (ref 11.5–15.5)
WBC: 4.6 10*3/uL (ref 4.0–10.5)

## 2021-03-05 ENCOUNTER — Other Ambulatory Visit: Payer: Self-pay

## 2021-03-05 ENCOUNTER — Encounter: Payer: Self-pay | Admitting: Emergency Medicine

## 2021-03-05 ENCOUNTER — Ambulatory Visit
Admission: EM | Admit: 2021-03-05 | Discharge: 2021-03-05 | Disposition: A | Discharge status: Home / Self Care | Payer: BC Managed Care – PPO | Attending: Physician Assistant | Admitting: Physician Assistant

## 2021-03-05 DIAGNOSIS — J069 Acute upper respiratory infection, unspecified: Secondary | ICD-10-CM

## 2021-03-05 DIAGNOSIS — R000 Tachycardia, unspecified: Secondary | ICD-10-CM

## 2021-03-05 NOTE — Discharge Instructions (Addendum)
Take tylenol and ibuprofen as needed for fever and/or pain. Drink plenty of fluids. Follow up with any further concerns. Check MyChart for results.

## 2021-03-05 NOTE — ED Triage Notes (Signed)
Headache, fever/chills, sore throat, nasal drainage/congestion, mild cough starting yesterday. HR 134 in triage, reports mild decrease in appetite, denies N/V/D
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
## 2021-03-06 LAB — COVID-19, FLU A+B NAA
Influenza A, NAA: NOT DETECTED
Influenza B, NAA: NOT DETECTED
SARS-CoV-2, NAA: NOT DETECTED

## 2021-04-13 IMAGING — US US PELVIS COMPLETE WITH TRANSVAGINAL
2 series · 13 of 25 positions shown · non-contrast
Comparison: None

CLINICAL DATA: Pelvic pain, LEFT lower quadrant pain for 2 weeks,
no menstrual cycle since November 2018



[Series 1: us pelvis complete with transvaginal · 0.26mm/px · 12 of 79 slices shown (1 of 2)]
[im 1/79]
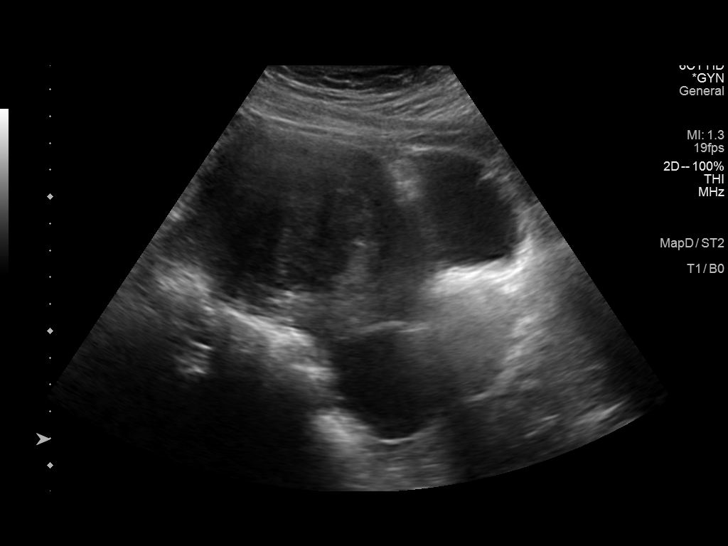
[im 7/79]
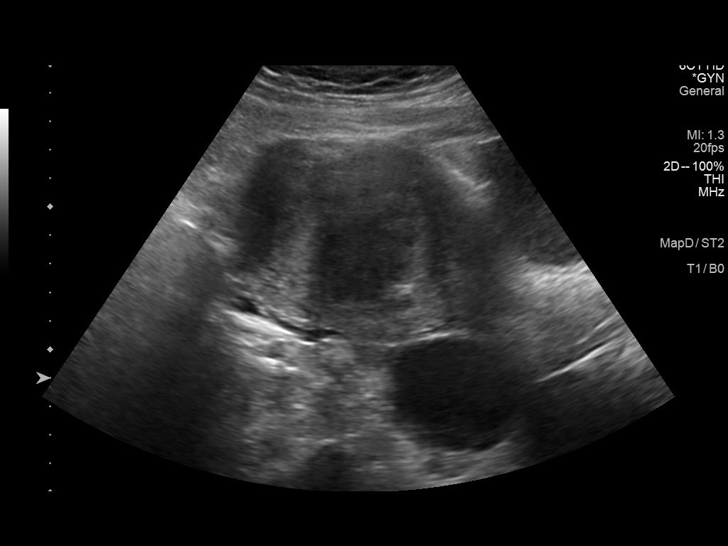
[im 14/79]
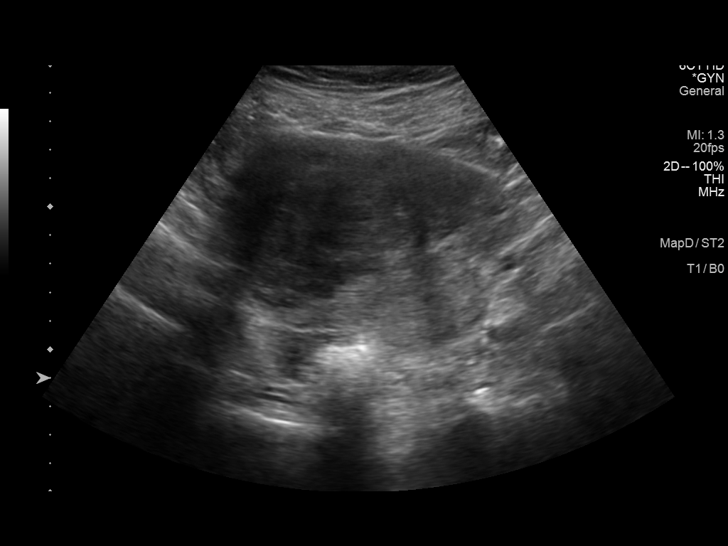
[im 21/79]
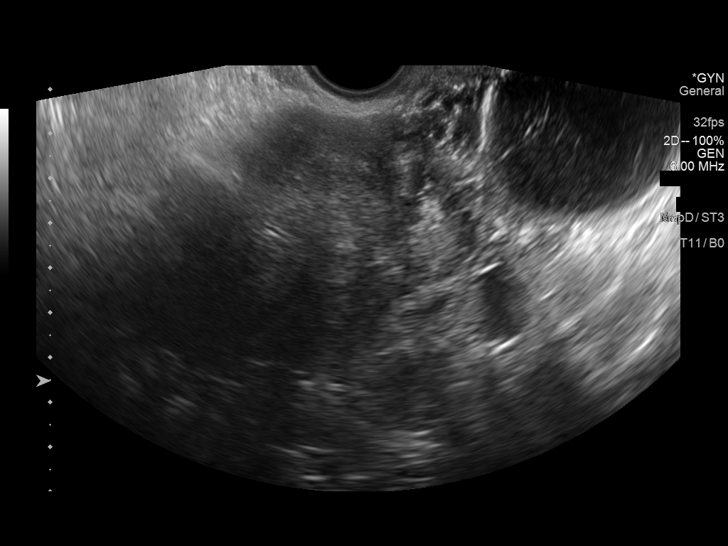
[im 28/79]
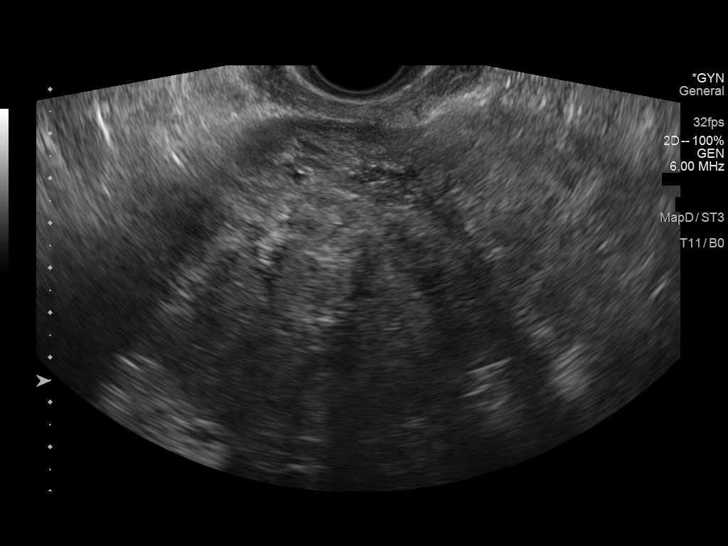
[im 34/79]
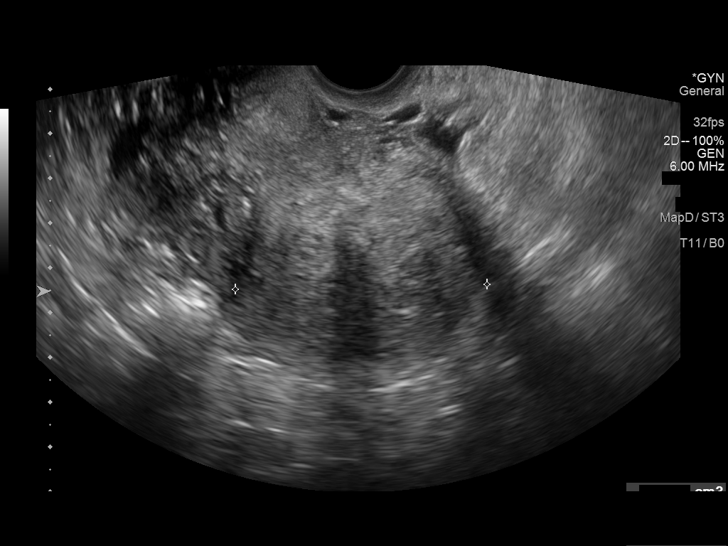
[im 41/79]
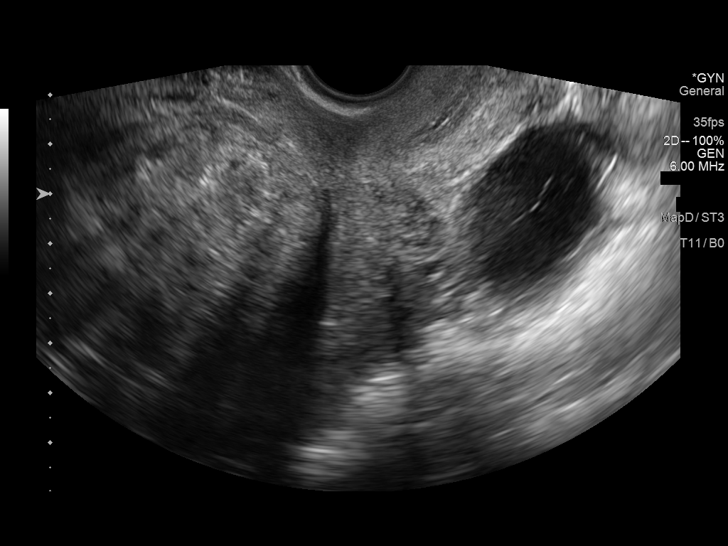
[im 48/79]
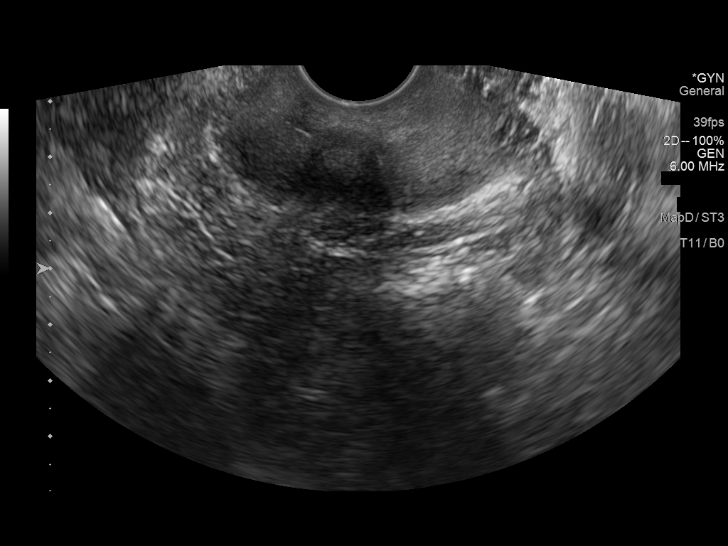
[im 55/79]
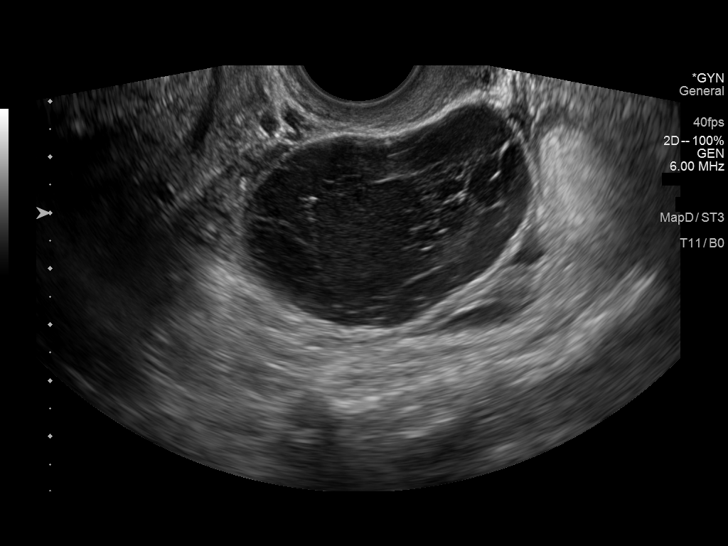
[im 62/79]
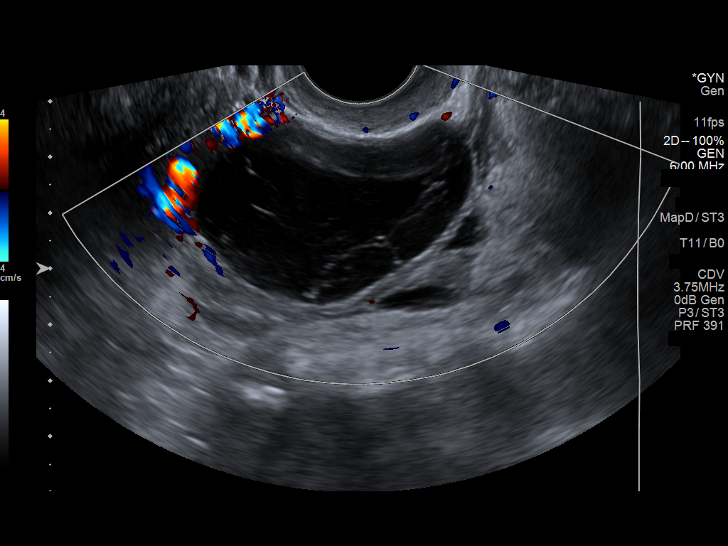
[im 68/79]
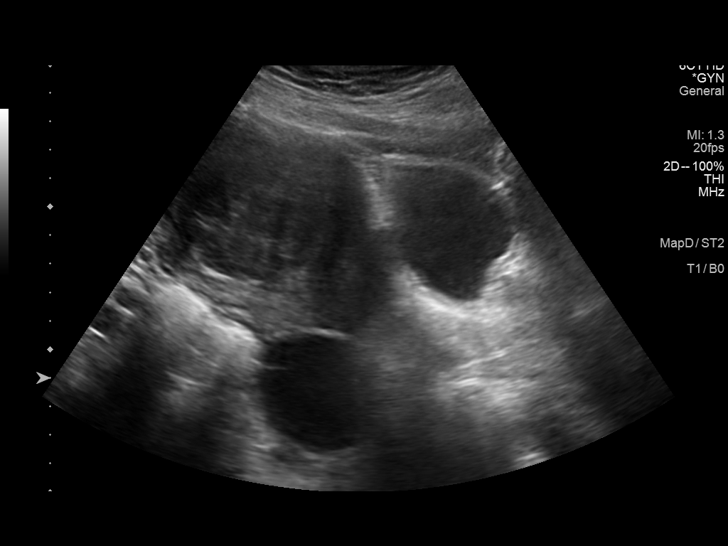
[im 75/79]
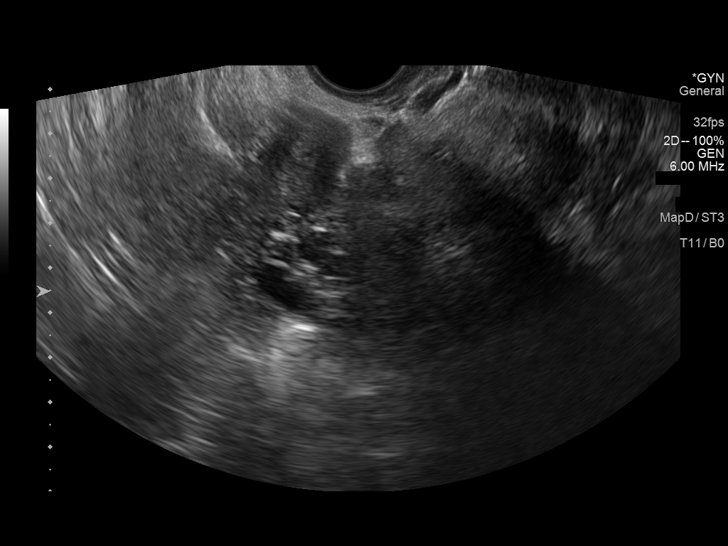

[Series 2001: us pelvis complete with transvaginal · 0.13mm/px · 1 of 2 slices shown (2 of 2)]
[im 1/2]
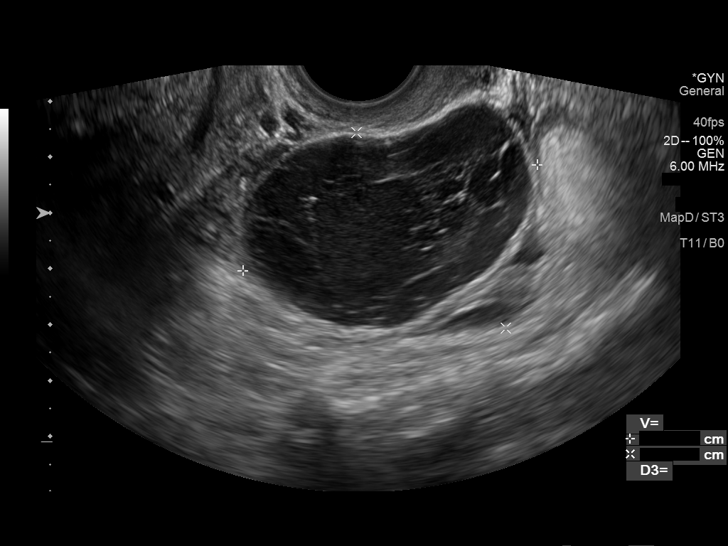

[13 of 25 positions shown; findings below may reference images not displayed]

FINDINGS: Uterus

Measurements: 14.1 x 7.1 x 9.9 cm = volume: 521 mL. Anteverted.
Several large leiomyomata are identified. Large fundal leiomyoma to
RIGHT 6.6 x 6.8 x 7.5 cm, likely extending submucosal. Posterior
upper leiomyoma, subserosal potentially extending submucosal 5.9 x
3.8 x 5.6 cm. Additional smaller intramural leiomyoma at anterior
upper LEFT uterus 2.2 x 1.6 x 2.2 cm. Remaining myometrium
heterogeneous.

Endometrium

Obscured due to presence of multiple leiomyomata

Right ovary

Measurements: 5.6 x 4.4 x 4.6 cm = volume: 59.0 mL. Complicated cyst
5.6 x 3.3 x 4.5 cm with Roney internal echogenic structure and
diffuse low level hypoechoic echo it material, appearance most
favoring a hemorrhagic cyst. No blood flow within this lesion on
color Doppler imaging.

Left ovary

Measurements: 3.2 x 2.1 x 2.6 cm = volume: 9.0 mL. Normal morphology
without mass

Other findings

Trace free pelvic fluid.  No adnexal masses.
IMPRESSION: Multiple uterine leiomyomata, obscuring the endometrial complex.

Complicated cystic lesion of the RIGHT ovary 5.6 cm in greatest size
favor hemorrhagic cyst; short-interval follow up ultrasound in 6-12
weeks is recommended, preferably during the week following the
patient's normal menses.

## 2021-04-13 IMAGING — US US ABDOMEN COMPLETE
1 series · 14 of 25 positions shown · non-contrast
Comparison: None.

CLINICAL DATA: Left lower quadrant abdominal pain for 2 weeks

EXAM:
ABDOMEN ULTRASOUND COMPLETE

[Series 1: us abdomen complete · 0.22mm/px · 14 of 80 slices shown]
[im 1/80]
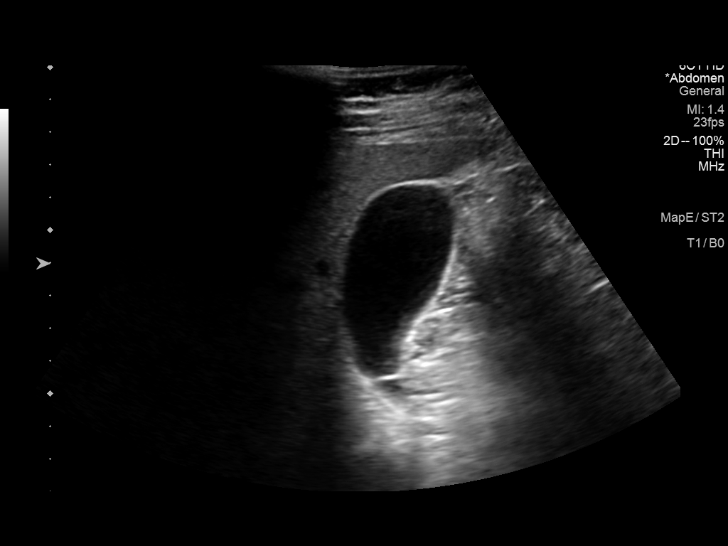
[im 7/80]
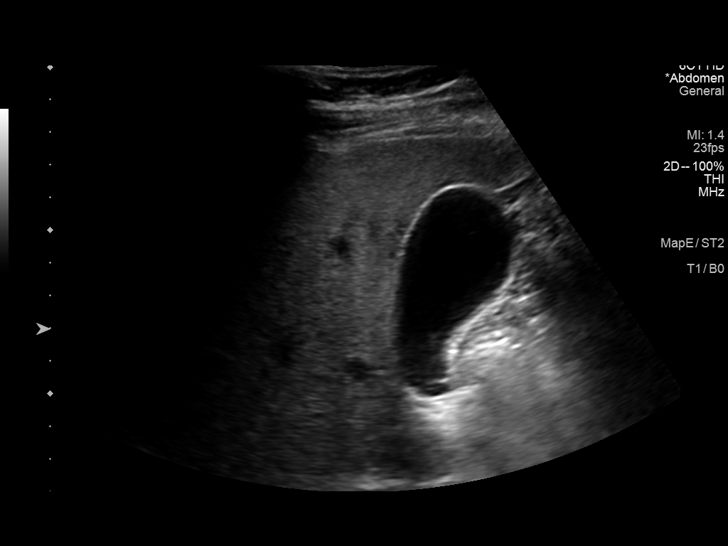
[im 14/80]
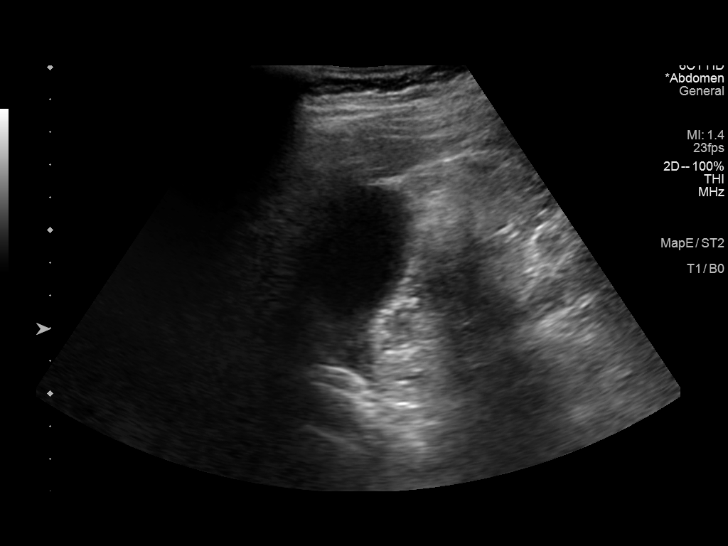
[im 20/80]
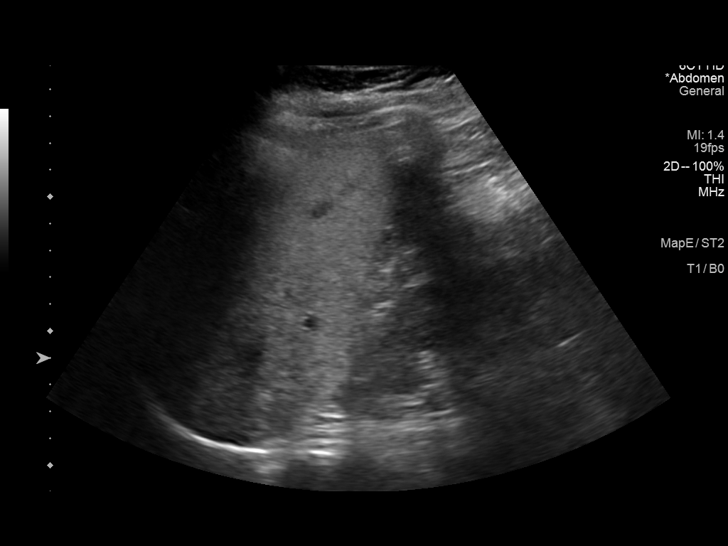
[im 27/80]
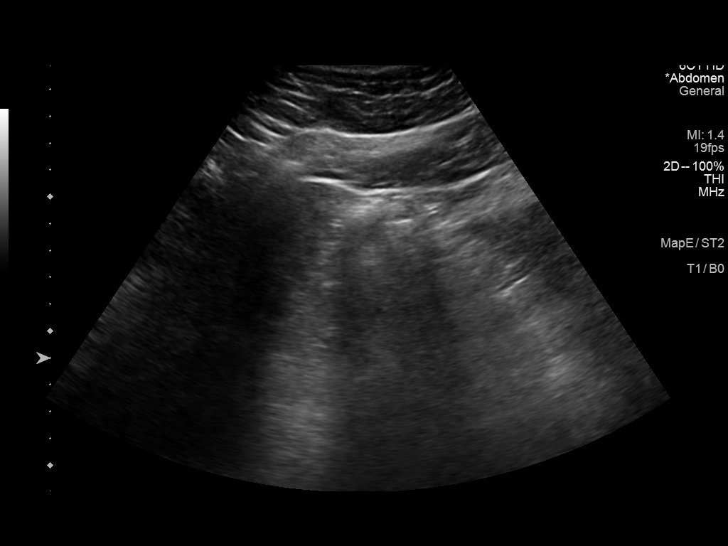
[im 30/80]
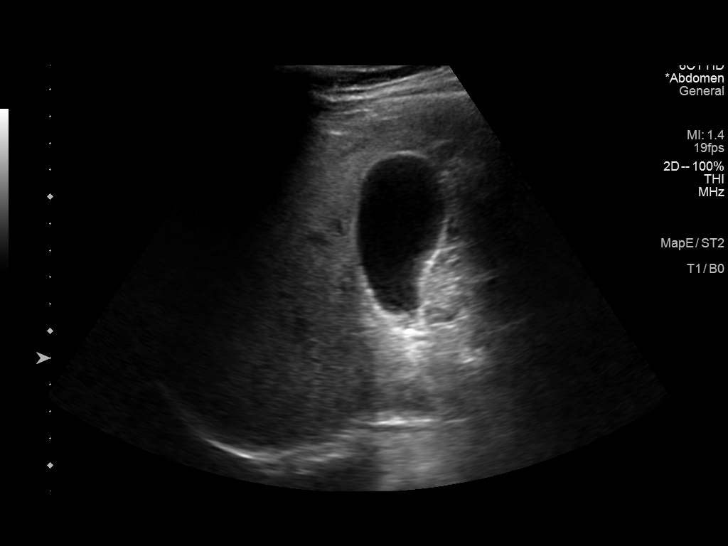
[im 37/80]
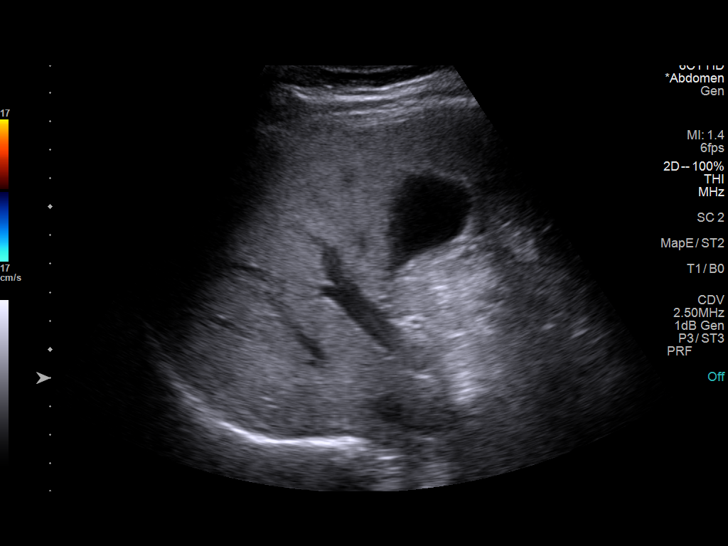
[im 43/80]
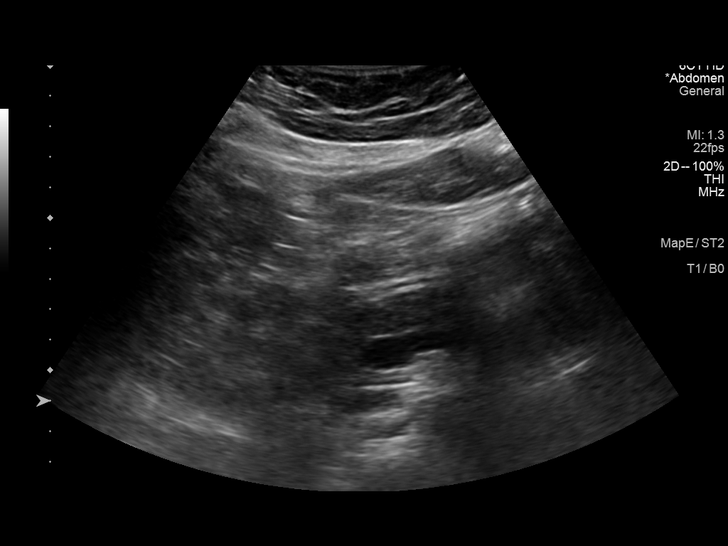
[im 50/80]
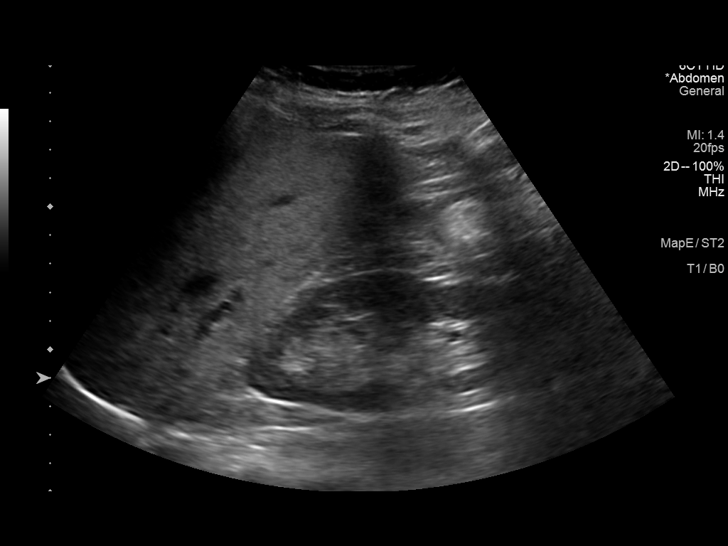
[im 53/80]
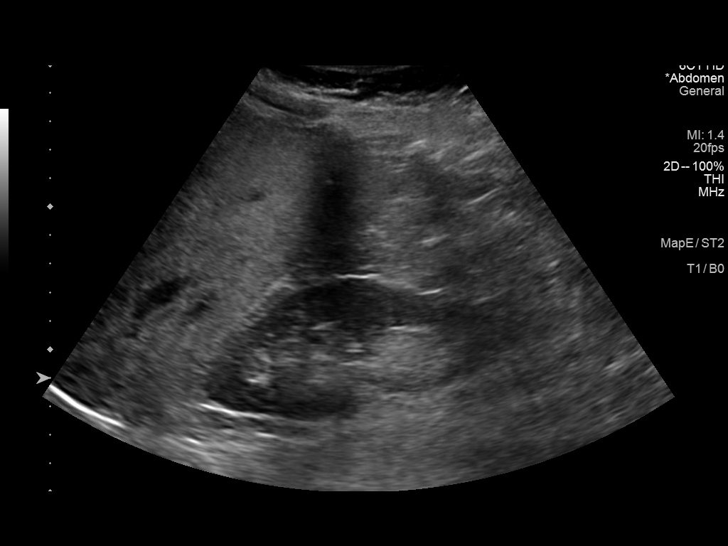
[im 60/80]
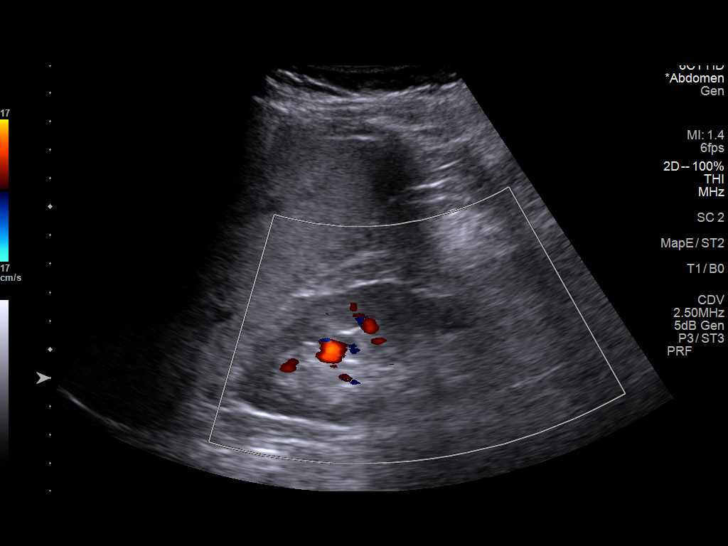
[im 66/80]
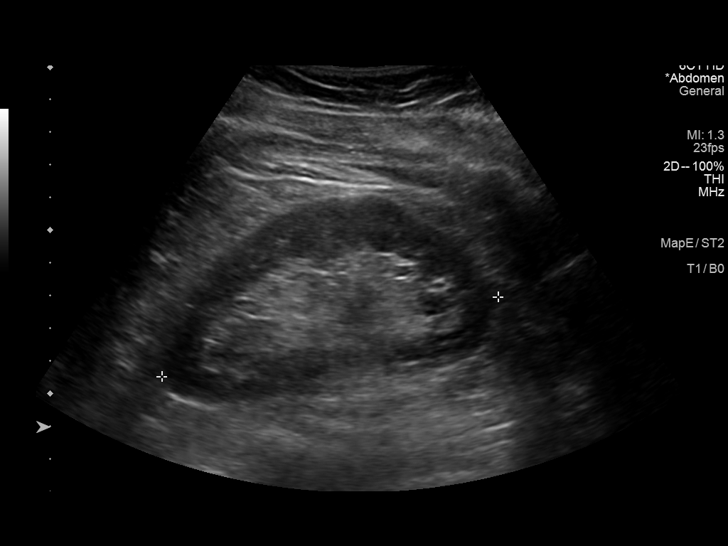
[im 73/80]
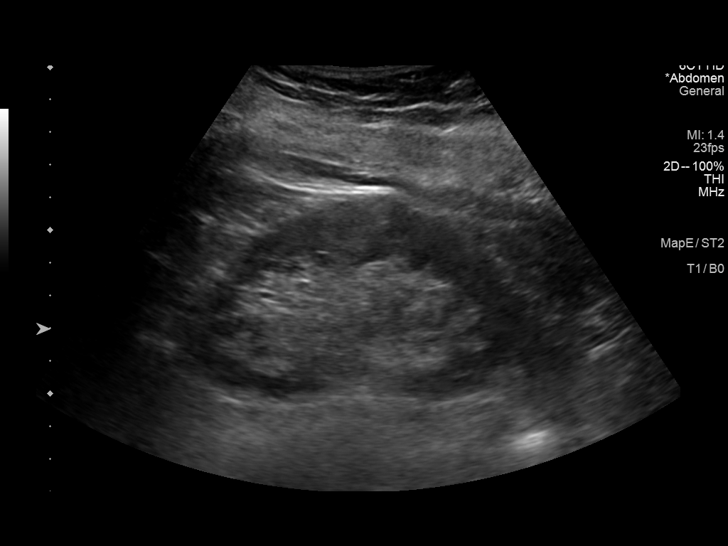
[im 80/80]
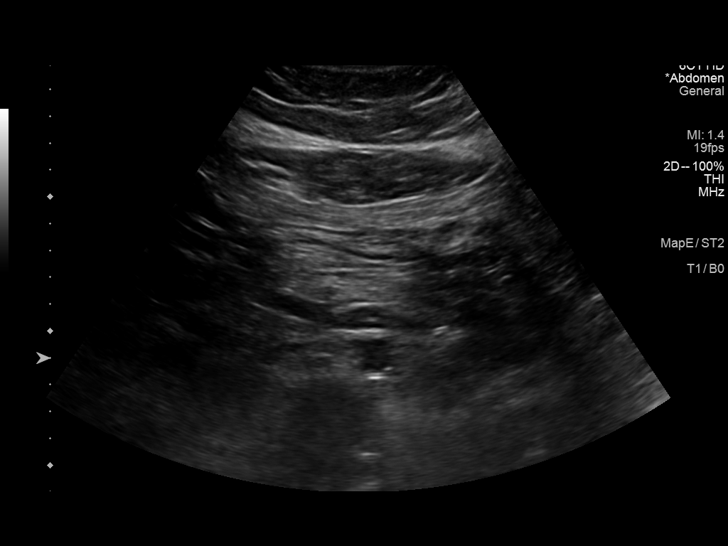

[14 of 25 positions shown; findings below may reference images not displayed]

FINDINGS: Gallbladder: No gallstones or wall thickening visualized. No
sonographic Murphy sign noted by sonographer.

Common bile duct: Diameter: 3 mm

Liver: No focal lesion identified. Mildly increased hepatic
parenchymal echogenicity. Portal vein is patent on color Doppler
imaging with normal direction of blood flow towards the liver.

IVC: No abnormality visualized.

Pancreas: Visualized portion unremarkable.

Spleen: Size and appearance within normal limits.

Right Kidney: Length: 11.5 cm. Echogenicity within normal limits. No
mass or hydronephrosis visualized.

Left Kidney: Length: 10.6 cm. Echogenicity within normal limits. No
mass or hydronephrosis visualized.

Abdominal aorta: No aneurysm visualized.

Other findings: None.
IMPRESSION: 1. The echogenicity of the liver is mildly increased. This is a
nonspecific finding but is most commonly seen with fatty
infiltration of the liver. There are no obvious focal liver lesions.
2. Otherwise unremarkable ultrasound of the abdomen.

## 2021-11-06 ENCOUNTER — Telehealth: Payer: Self-pay | Admitting: Family

## 2021-11-06 NOTE — Telephone Encounter (Signed)
I am not sure if I am still her PCP- have not seen her since 04/2019; received information regarding sleep study/ recommended treatment  1) Is there someone we need to get these results sent to? 2) Who is her PCP so records are updating appropriately. If she is staying at [facility], she needs to get established with new PCP. 3) If I am her PCP, she needs to schedule an OV with me.

## 2021-11-07 NOTE — Telephone Encounter (Signed)
My chart message sent to pt.

## 2022-01-22 ENCOUNTER — Telehealth: Payer: Self-pay

## 2022-01-22 NOTE — Telephone Encounter (Signed)
I have called pt and she stated that she is not coming to [place] and she sees a provider in [facility] and doesn't see Vernona Rieger anymore. I stated understanding.

## 2022-12-18 ENCOUNTER — Telehealth: Payer: Self-pay | Admitting: *Deleted

## 2022-12-18 ENCOUNTER — Ambulatory Visit
Admission: EM | Admit: 2022-12-18 | Discharge: 2022-12-18 | Disposition: A | Discharge status: Home / Self Care | Payer: Commercial Managed Care - HMO | Attending: Physician Assistant | Admitting: Physician Assistant

## 2022-12-18 DIAGNOSIS — N760 Acute vaginitis: Secondary | ICD-10-CM | POA: Insufficient documentation

## 2022-12-18 DIAGNOSIS — U071 COVID-19: Secondary | ICD-10-CM | POA: Diagnosis present

## 2022-12-18 DIAGNOSIS — R509 Fever, unspecified: Secondary | ICD-10-CM

## 2022-12-18 DIAGNOSIS — E669 Obesity, unspecified: Secondary | ICD-10-CM | POA: Insufficient documentation

## 2022-12-18 DIAGNOSIS — J069 Acute upper respiratory infection, unspecified: Secondary | ICD-10-CM

## 2022-12-18 DIAGNOSIS — H9202 Otalgia, left ear: Secondary | ICD-10-CM

## 2022-12-18 MED ORDER — AMOXICILLIN 500 MG PO CAPS: 500.0000 mg | ORAL_CAPSULE | Freq: Three times a day (TID) | ORAL | 0 refills | Status: AC

## 2022-12-18 MED ORDER — ACETAMINOPHEN 325 MG PO TABS
650.0000 mg | ORAL_TABLET | Freq: Once | ORAL | Status: AC
  Administered 2022-12-18: 650 mg via ORAL

## 2022-12-18 MED ORDER — AMOXICILLIN 500 MG PO CAPS: 500.0000 mg | ORAL_CAPSULE | Freq: Three times a day (TID) | ORAL | 0 refills | Status: DC

## 2022-12-18 NOTE — ED Triage Notes (Signed)
"  I am having body aches, headaches and ear pain in left ear". Started last night "and not improving". Fever "unknown degree". No runny nose. "Some cough once in a while".
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
## 2022-12-19 LAB — SARS CORONAVIRUS 2 (TAT 6-24 HRS): SARS Coronavirus 2: POSITIVE — AB

## 2023-08-05 ENCOUNTER — Other Ambulatory Visit: Payer: Self-pay | Admitting: Obstetrics and Gynecology

## 2023-08-05 DIAGNOSIS — Z1231 Encounter for screening mammogram for malignant neoplasm of breast: Secondary | ICD-10-CM

## 2023-10-07 ENCOUNTER — Ambulatory Visit
Admission: RE | Admit: 2023-10-07 | Discharge: 2023-10-07 | Disposition: A | Discharge status: Home / Self Care | Payer: Self-pay | Source: Ambulatory Visit | Attending: Obstetrics and Gynecology | Admitting: Obstetrics and Gynecology

## 2023-10-07 DIAGNOSIS — Z1231 Encounter for screening mammogram for malignant neoplasm of breast: Secondary | ICD-10-CM
